# Patient Record
Sex: Male | Born: 1950 | Race: Black or African American | Hispanic: No | Marital: Married | Smoking: Never smoker
Health system: Southern US, Community
[De-identification: ages and names within clinical notes are randomized; demographics above are authoritative.]

## PROBLEM LIST (undated history)

## (undated) DIAGNOSIS — I10 Essential (primary) hypertension: Secondary | ICD-10-CM

## (undated) DIAGNOSIS — J301 Allergic rhinitis due to pollen: Secondary | ICD-10-CM

## (undated) DIAGNOSIS — J189 Pneumonia, unspecified organism: Secondary | ICD-10-CM

## (undated) DIAGNOSIS — C801 Malignant (primary) neoplasm, unspecified: Secondary | ICD-10-CM

## (undated) DIAGNOSIS — D569 Thalassemia, unspecified: Secondary | ICD-10-CM

## (undated) DIAGNOSIS — N400 Enlarged prostate without lower urinary tract symptoms: Secondary | ICD-10-CM

## (undated) HISTORY — DX: Thalassemia, unspecified: D56.9

## (undated) HISTORY — PX: HERNIA REPAIR: SHX51

## (undated) HISTORY — PX: ESOPHAGOGASTRODUODENOSCOPY: SHX1529

## (undated) HISTORY — DX: Pneumonia, unspecified organism: J18.9

## (undated) HISTORY — DX: Allergic rhinitis due to pollen: J30.1

## (undated) HISTORY — PX: TONSILLECTOMY: SUR1361

## (undated) HISTORY — DX: Benign prostatic hyperplasia without lower urinary tract symptoms: N40.0

## (undated) HISTORY — PX: TRANSURETHRAL RESECTION OF PROSTATE: SHX73

---

## 2007-11-05 HISTORY — PX: COLONOSCOPY: SHX174

## 2009-11-24 ENCOUNTER — Emergency Department: Payer: Self-pay | Admitting: Emergency Medicine

## 2009-11-24 IMAGING — CT CT ABD-PELV W/ CM
1 of 2 series · 15 of 32 positions shown, 19 images · non-contrast
Comparison: none

REASON FOR EXAM: (1) L sided abd pain; (2) L sided abd pain, PO and IV
contrast
COMMENTS:   May transport without cardiac monitor

[Series 2: abdomen · axial · 0.69mm/px · z∈[-514,-134]mm · 15 of 84 slices shown, 19 images]
[im 4/84  soft-tissue]
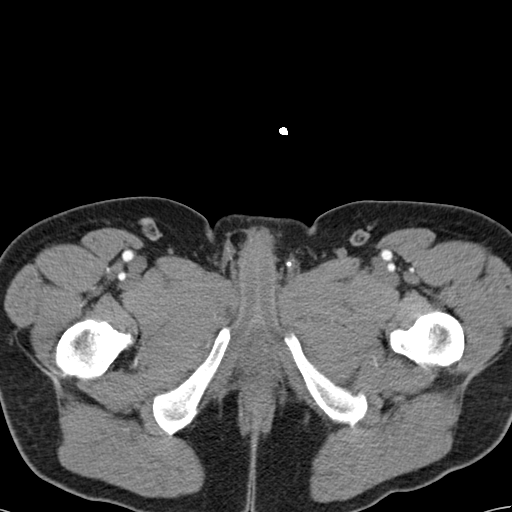
[im 4/84  bone]
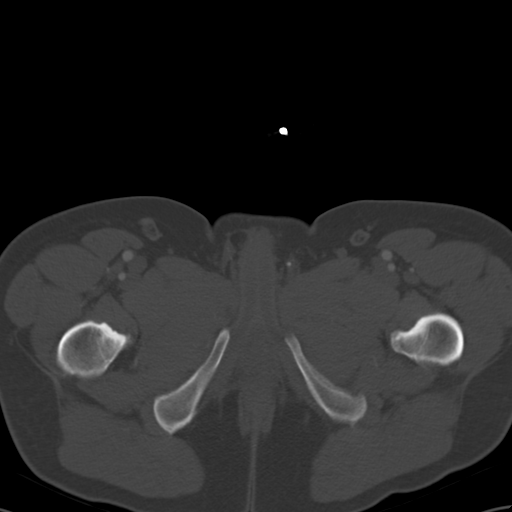
[im 10/84  soft-tissue]
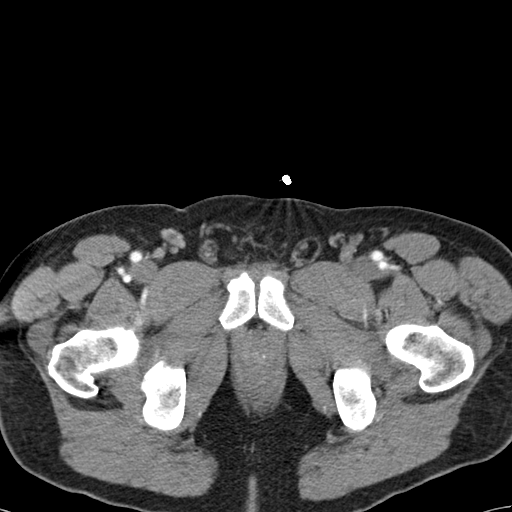
[im 17/84  soft-tissue]
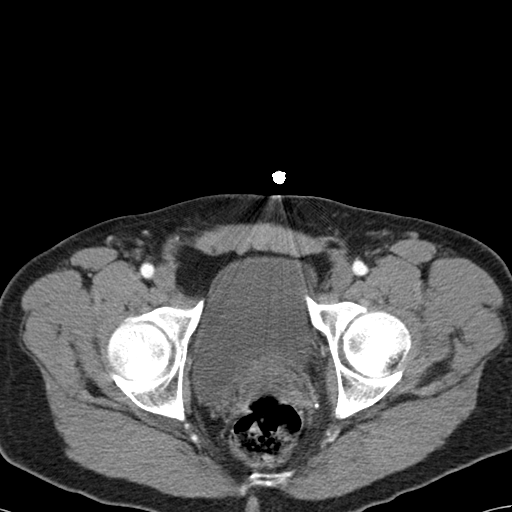
[im 24/84  soft-tissue]
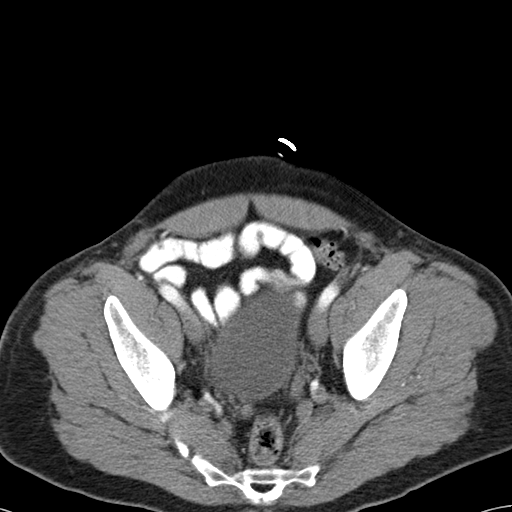
[im 30/84  soft-tissue]
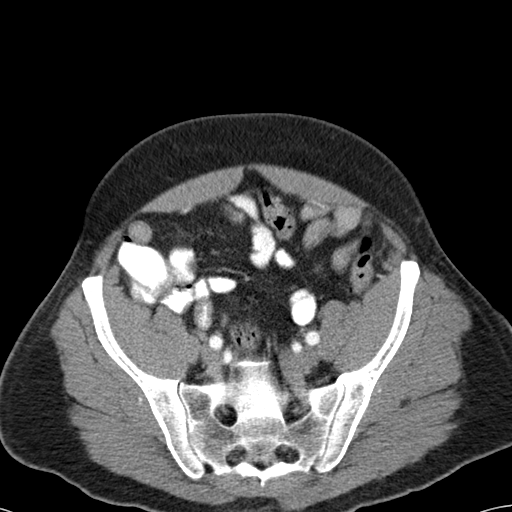
[im 37/84  soft-tissue]
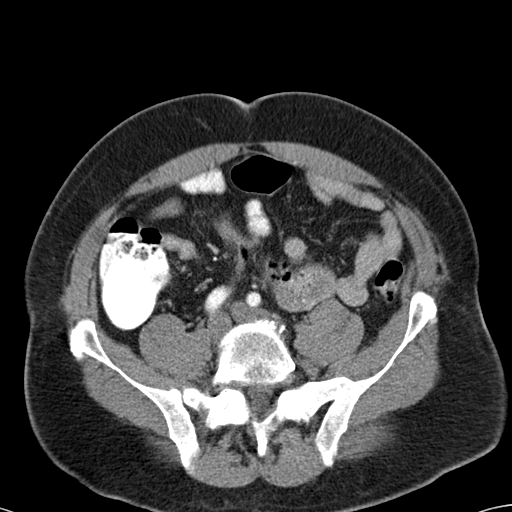
[im 44/84  soft-tissue]
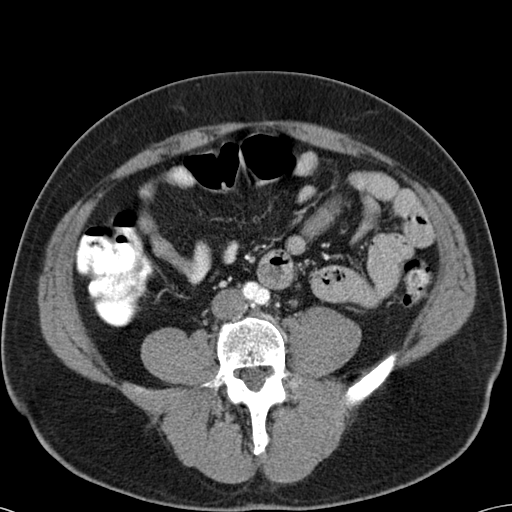
[im 47/84  soft-tissue]
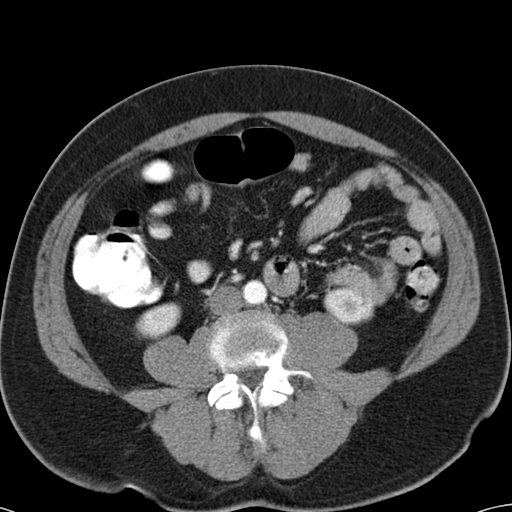
[im 54/84  soft-tissue]
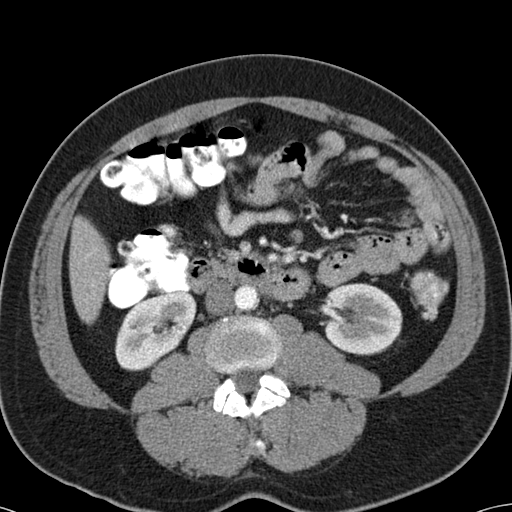
[im 54/84  bone]
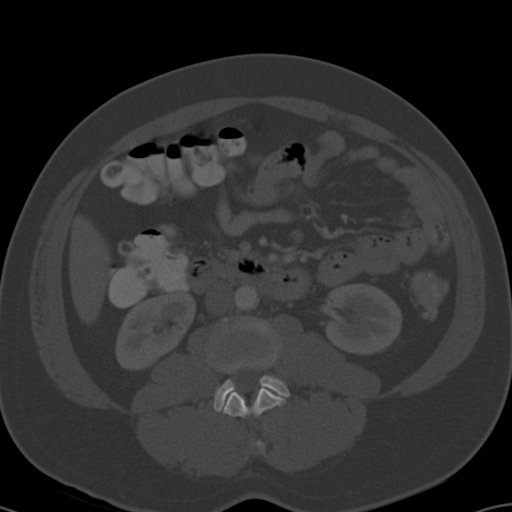
[im 60/84  soft-tissue]
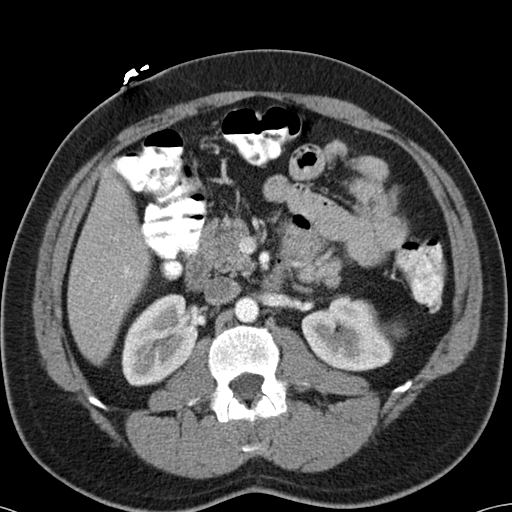
[im 67/84  soft-tissue]
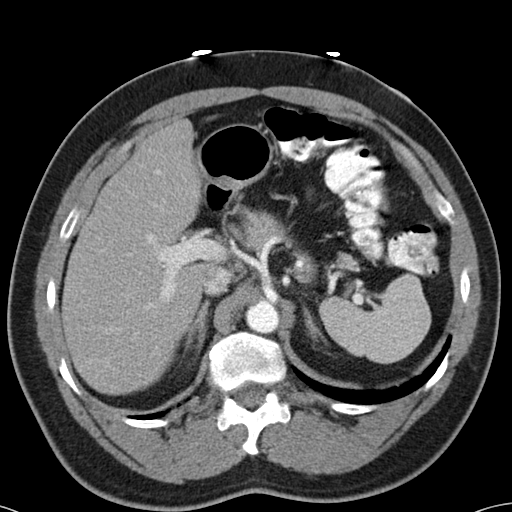
[im 70/84  lung]
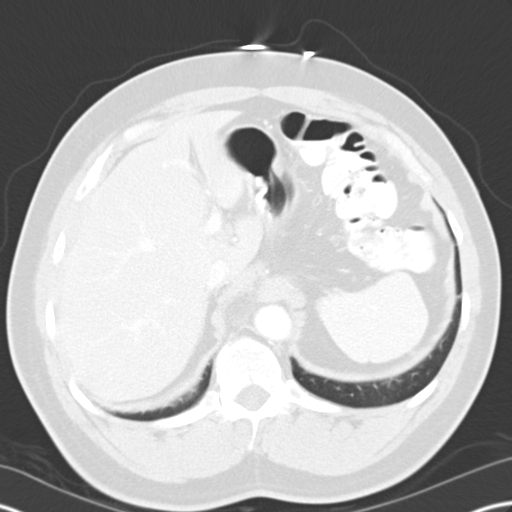
[im 74/84  soft-tissue]
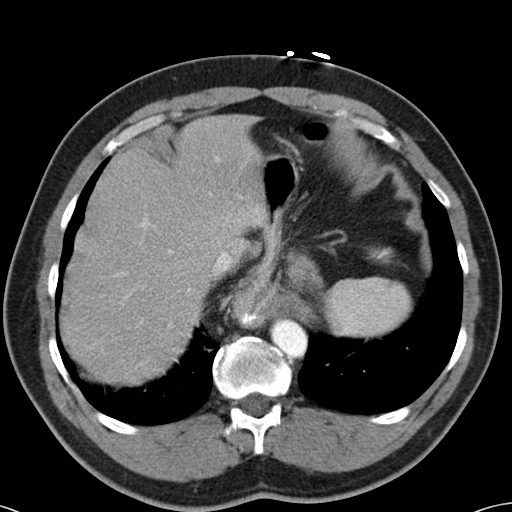
[im 74/84  lung]
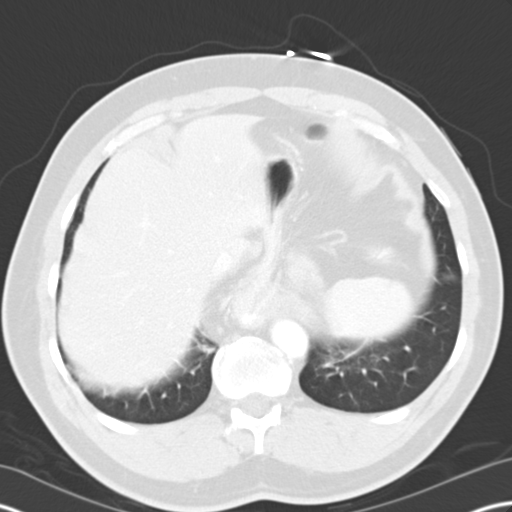
[im 77/84  lung]
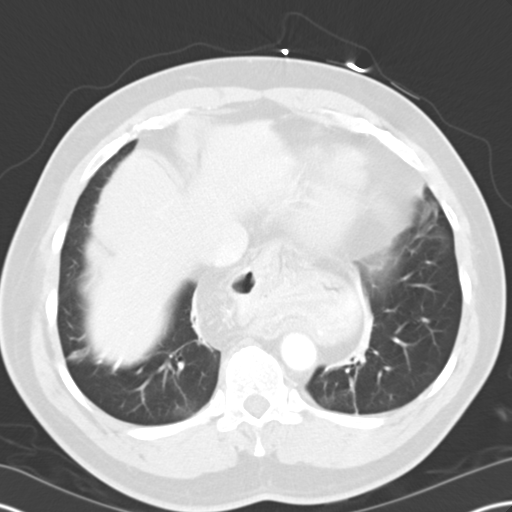
[im 80/84  soft-tissue]
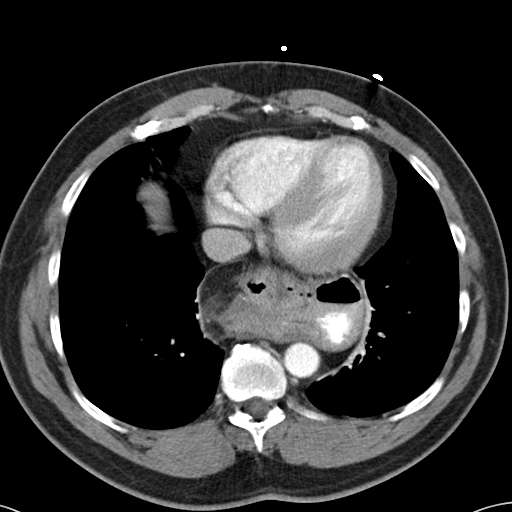
[im 80/84  lung]
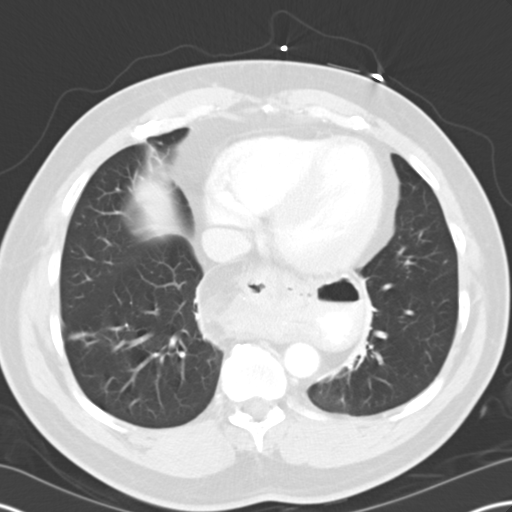

[15 of 32 positions shown; findings below may reference images not displayed]

PROCEDURE:     CT  - CT ABDOMEN / PELVIS  W  - [DATE]  [DATE]

RESULT:     CT of the abdomen and pelvis is performed utilizing 100 ml of
[EY] iodinated intravenous contrast along with oral contrast. There is
no previous CT for comparison. Images are reconstructed at 5 mm slice
thickness in the axial plane.

Images through the base of the lungs demonstrate minimal linear atelectasis
or fibrosis. There is a moderate to large hiatal hernia. There is some
thickening of the wall of the distal esophagus which could be secondary to
esophagitis. The heart appears normal in size. The liver, spleen, adrenal
glands and aorta appear to be within normal limits. The prostate is
borderline to mildly enlarged with some areas of calcification. Phleboliths
are present. A fat-filled umbilical hernia is present. There is no abnormal
bowel distention or bowel wall thickening. The pancreas shows no mass or
definite evidence of peripancreatic inflammation. There is a non-obstructing
2 mm stone in the lower pole the left kidney. The kidneys otherwise appear
to be unremarkable.
IMPRESSION: 1. Small fat-filled umbilical hernia.
2. Non-obstructing lower pole left renal calculus.
3. Moderately large hiatal hernia.

## 2010-01-26 ENCOUNTER — Emergency Department (HOSPITAL_COMMUNITY): Admission: EM | Admit: 2010-01-26 | Discharge: 2010-01-26 | Payer: Self-pay | Admitting: Emergency Medicine
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
## 2011-01-28 LAB — BASIC METABOLIC PANEL
BUN: 15 mg/dL (ref 6–23)
CO2: 24 mEq/L (ref 19–32)
Chloride: 110 mEq/L (ref 96–112)
Creatinine, Ser: 1.1 mg/dL (ref 0.4–1.5)
Potassium: 4 mEq/L (ref 3.5–5.1)
Sodium: 139 mEq/L (ref 135–145)

## 2011-01-28 LAB — DIFFERENTIAL
Band Neutrophils: 2 % (ref 0–10)
Basophils Relative: 0 % (ref 0–1)
Blasts: 0 %
Eosinophils Relative: 0 % (ref 0–5)
Lymphocytes Relative: 4 % — ABNORMAL LOW (ref 12–46)
Lymphs Abs: 0.4 10*3/uL — ABNORMAL LOW (ref 0.7–4.0)
Metamyelocytes Relative: 0 %
Monocytes Absolute: 0.6 10*3/uL (ref 0.1–1.0)
Myelocytes: 0 %
Neutrophils Relative %: 88 % — ABNORMAL HIGH (ref 43–77)
Promyelocytes Absolute: 0 %

## 2011-01-28 LAB — CBC
Hemoglobin: 11.3 g/dL — ABNORMAL LOW (ref 13.0–17.0)
MCHC: 31.8 g/dL (ref 30.0–36.0)
WBC: 9.9 10*3/uL (ref 4.0–10.5)

## 2011-01-28 LAB — SAMPLE TO BLOOD BANK

## 2012-03-04 HISTORY — PX: ESOPHAGOGASTRODUODENOSCOPY: SHX1529

## 2012-06-04 DIAGNOSIS — D569 Thalassemia, unspecified: Secondary | ICD-10-CM

## 2012-06-04 HISTORY — DX: Thalassemia, unspecified: D56.9

## 2013-01-18 ENCOUNTER — Emergency Department (HOSPITAL_COMMUNITY): Payer: BC Managed Care – PPO

## 2013-01-18 ENCOUNTER — Encounter (HOSPITAL_COMMUNITY): Payer: Self-pay

## 2013-01-18 ENCOUNTER — Emergency Department (HOSPITAL_COMMUNITY)
Admission: EM | Admit: 2013-01-18 | Discharge: 2013-01-18 | Disposition: A | Discharge status: Home / Self Care | Payer: BC Managed Care – PPO | Attending: Emergency Medicine | Admitting: Emergency Medicine

## 2013-01-18 DIAGNOSIS — Y939 Activity, unspecified: Secondary | ICD-10-CM | POA: Insufficient documentation

## 2013-01-18 DIAGNOSIS — S62639B Displaced fracture of distal phalanx of unspecified finger, initial encounter for open fracture: Secondary | ICD-10-CM | POA: Insufficient documentation

## 2013-01-18 DIAGNOSIS — W230XXA Caught, crushed, jammed, or pinched between moving objects, initial encounter: Secondary | ICD-10-CM | POA: Insufficient documentation

## 2013-01-18 DIAGNOSIS — Y929 Unspecified place or not applicable: Secondary | ICD-10-CM | POA: Insufficient documentation

## 2013-01-18 DIAGNOSIS — I10 Essential (primary) hypertension: Secondary | ICD-10-CM | POA: Insufficient documentation

## 2013-01-18 DIAGNOSIS — S62609B Fracture of unspecified phalanx of unspecified finger, initial encounter for open fracture: Secondary | ICD-10-CM

## 2013-01-18 HISTORY — DX: Essential (primary) hypertension: I10

## 2013-01-18 IMAGING — CR DG FINGER MIDDLE 2+V*L*
1 series · 1 of 1 positions shown · non-contrast
Comparison: None.

CLINICAL DATA: Finger caught and with stone.  Middle finger pain,
swelling, laceration.

LEFT MIDDLE FINGER 2+V

[view not recorded]
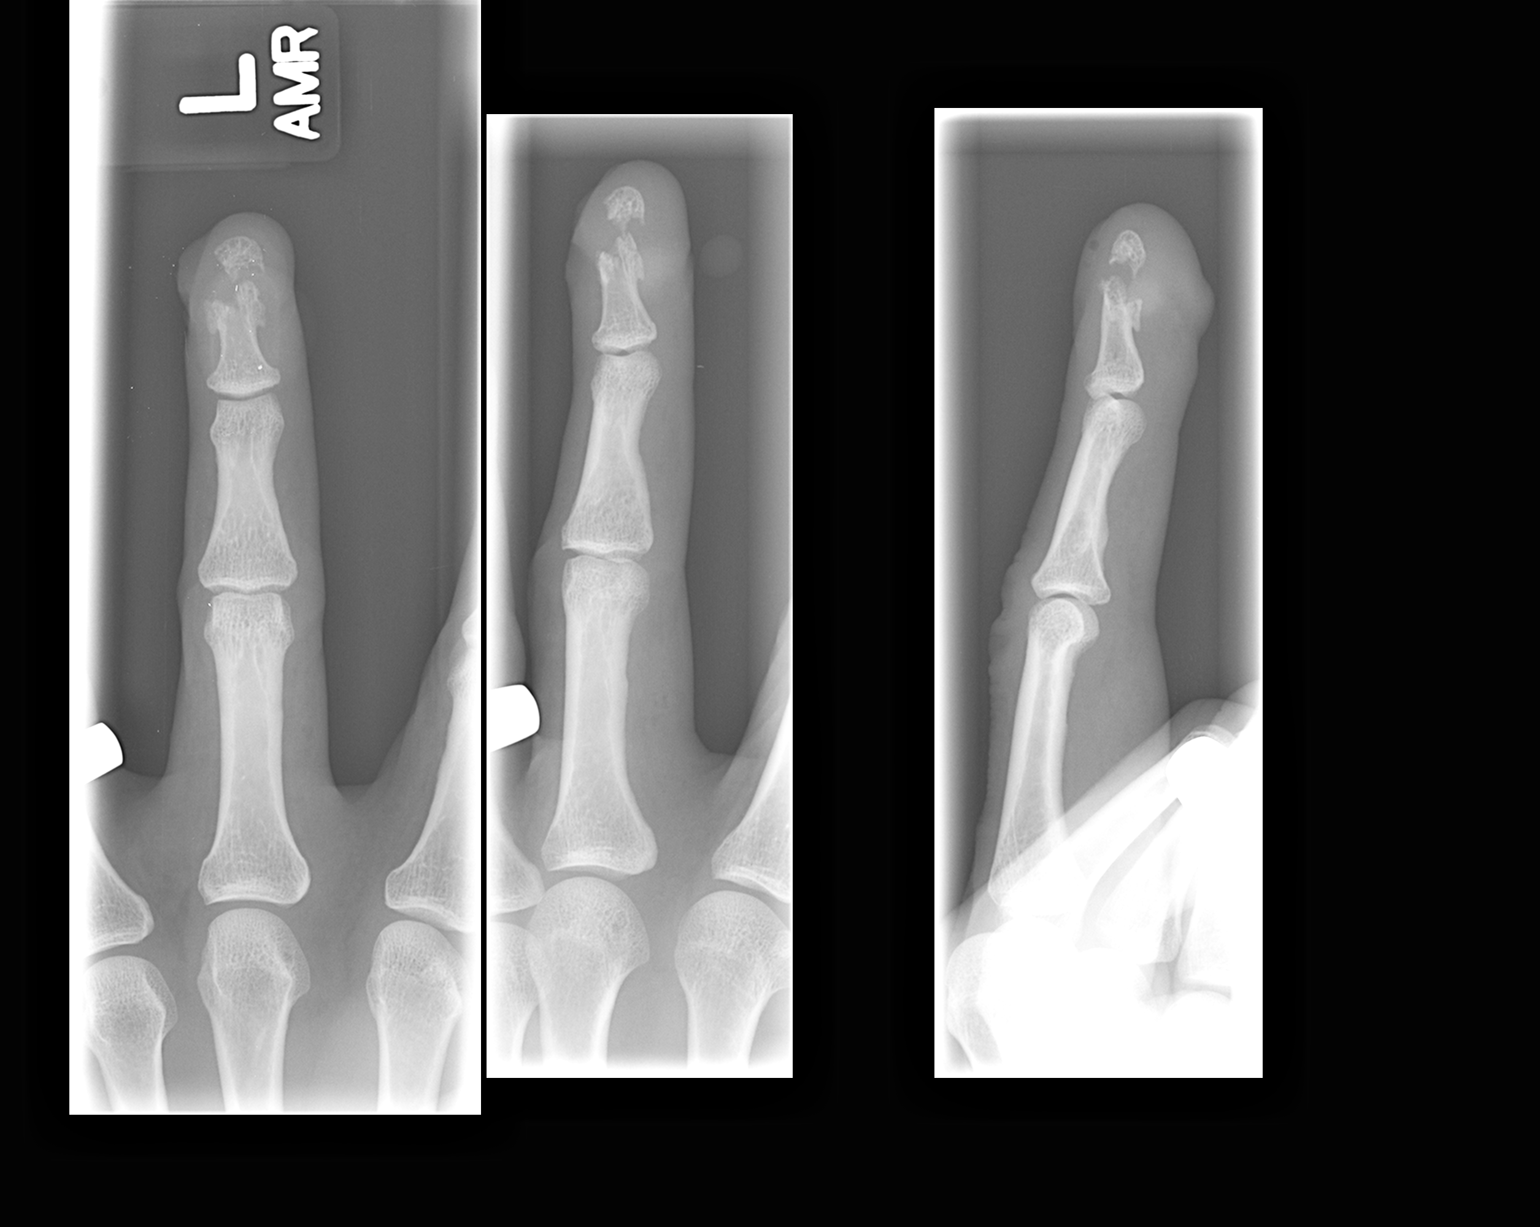

[1 of 1 positions shown; findings below may reference images not displayed]

FINDINGS: A comminuted displaced fracture of the tuft of the distal
phalanx is seen.  There is associated soft tissue swelling and a
small amount of soft tissue gas.  No radiopaque foreign body
identified.  No evidence of dislocation.
IMPRESSION: Comminuted, displaced fracture of the tuft of the distal phalanx.

## 2013-01-18 MED ORDER — AMOXICILLIN-POT CLAVULANATE 875-125 MG PO TABS: 1.0000 | ORAL_TABLET | Freq: Two times a day (BID) | ORAL | Status: DC

## 2013-01-18 MED ORDER — LIDOCAINE HCL (PF) 2 % IJ SOLN
INTRAMUSCULAR | Status: AC
  Administered 2013-01-18: 10:00:00
  Filled 2013-01-18: qty 10

## 2013-01-18 NOTE — ED Provider Notes (Signed)
History     CSN: [id number]  Arrival date & time 01/18/13  0713   First MD Initiated Contact with Patient 01/18/13 [phone]      Chief Complaint  Patient presents with  . Laceration    (Consider location/radiation/quality/duration/timing/severity/associated sxs/prior treatment) HPI Comments: Walter Harrison is a 62 y.o. Male presenting with a crush injury and laceration to his left distal Koper finger.  He was putting wood into his woodstove when his finger was caught between the wood and the edge of the stove.  He has laceration and nail plate avulsion of this finger.  It bled a moderate amount but is currently hemostatic.  He is right handed and is utd on his tetanus.     The history is provided by the patient.    Past Medical History  Diagnosis Date  . Hypertension     Past Surgical History  Procedure Laterality Date  . Hernia repair      No family history on file.  History  Substance Use Topics  . Smoking status: Never Smoker   . Smokeless tobacco: Not on file  . Alcohol Use: No      Review of Systems  Constitutional: Negative for fever.  Musculoskeletal: Positive for arthralgias. Negative for myalgias.  Skin: Positive for wound.  Neurological: Negative for weakness and numbness.    Allergies  Review of patient's allergies indicates not on file.  Home Medications  No current outpatient prescriptions on file.  BP 159/94  Pulse 98  Temp(Src) 97.7 F (36.5 C) (Oral)  Resp 20  Ht 5\' 8"  (1.727 m)  Wt 167 lb (75.751 kg)  BMI 25.4 kg/m2  SpO2 99%  Physical Exam  Constitutional: He is oriented to person, place, and time. He appears well-developed and well-nourished.  HENT:  Head: Normocephalic.  Cardiovascular: Normal rate.   Pulmonary/Chest: Effort normal.  Musculoskeletal: He exhibits tenderness.  Neurological: He is alert and oriented to person, place, and time. No sensory deficit.  Skin: Laceration noted.  Laceration on volar left 3rd Birchard finger,  horizontal mid tuft,  And wraps around to the lateral nail plate which is partially avulsed.  Distal sensation intact.      ED Course  Procedures (including critical care time)  Labs Reviewed - No data to display No results found.   No diagnosis found.    MDM  Spoke with Dr Janee Morn.  Advised to loosely approximate with chromic,  Augmentin,  Will see for a recheck in his office in 2-3 days.  Call back from his office staff,  Pt to be seen on Thursday in his office,  Will call patient with appt time.   Pt has oxycodone at home from his hernia surgery 1/14. Will use this for pain relief,  Also encouraged ice and elevation.    LACERATION REPAIR Performed by: Burgess Amor Authorized by: Burgess Amor Consent: Verbal consent obtained. Risks and benefits: risks, benefits and alternatives were discussed Consent given by: patient Patient identity confirmed: provided demographic data Prepped and Draped in normal sterile fashion Wound explored  Laceration Location: left Woessner finger  Laceration Length: 3cm  No Foreign Bodies seen or palpated  Anesthesia: digital block  Local anesthetic: lidocaine 2% without epinephrine  Anesthetic total: 2 ml  Irrigation method: syringe using saline after soak in saline,  Used wound clenz spray also before sutures placed Amount of cleaning: standard  Skin closure: chromic 4-0  Number of sutures: 4  Loosely approximated  Technique: simple interrupted  Patient tolerance: Patient  tolerated the procedure well with no immediate complications.  Note:  During procedure,  The nail plate became increasingly loose,  Barely attached at the lateral nail bed margin,  So removed.  The nail bed is injured,  Macerated type injury not amenable to simple suturing.    Finger was dressing,  First with xeroform,  Then bulky dressing.         Burgess Amor, PA-C 01/18/13 [phone]

## 2013-01-18 NOTE — ED Notes (Signed)
Pt was putting wood on fireplace this am and cut left hand middle finger.

## 2013-01-18 NOTE — ED Notes (Signed)
Gave pt basin with saline and betadine to soak his finger.

## 2013-01-19 NOTE — ED Provider Notes (Signed)
Medical screening examination/treatment/procedure(s) were conducted as a shared visit with non-physician practitioner(s) and myself.  I personally evaluated the patient during the encounter.  Open fracture to the distal phalanx of the left third digit. Primary repair in the emergency dept.  Referral to hand surgeon.  Donnetta Hutching, MD 01/19/13 [phone]

## 2014-02-15 ENCOUNTER — Telehealth: Payer: Self-pay | Admitting: *Deleted

## 2014-02-15 NOTE — Telephone Encounter (Signed)
I called pt and he said he has had a couple of colonoscopies before by Dr. Posey Pronto . His last one was about 5 years ago. He knows he had some polyps, he is not sure what kind but said he was supposed to get his next one in 5 years and it has been about that [place].  OV with Neil Crouch, PA on 5/18/2015at 8:30 AM.  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

## 2014-02-15 NOTE — Telephone Encounter (Signed)
Pt called to schedule a TCS. Please advise HOME # [phone] or CELL [phone]

## 2014-02-16 ENCOUNTER — Encounter: Payer: Self-pay | Admitting: *Deleted

## 2014-03-21 ENCOUNTER — Ambulatory Visit: Payer: BC Managed Care – PPO | Admitting: Gastroenterology

## 2014-03-24 ENCOUNTER — Ambulatory Visit (INDEPENDENT_AMBULATORY_CARE_PROVIDER_SITE_OTHER): Payer: BC Managed Care – PPO | Admitting: Gastroenterology

## 2014-03-24 ENCOUNTER — Encounter (INDEPENDENT_AMBULATORY_CARE_PROVIDER_SITE_OTHER): Payer: Self-pay

## 2014-03-24 ENCOUNTER — Encounter: Payer: Self-pay | Admitting: Gastroenterology

## 2014-03-24 ENCOUNTER — Other Ambulatory Visit: Payer: Self-pay | Admitting: Internal Medicine

## 2014-03-24 VITALS — BP 154/94 | HR 66 | Temp 97.4°F | Ht 68.0 in | Wt 185.4 lb

## 2014-03-24 DIAGNOSIS — Z860100 Personal history of colon polyps, unspecified: Secondary | ICD-10-CM | POA: Insufficient documentation

## 2014-03-24 DIAGNOSIS — K219 Gastro-esophageal reflux disease without esophagitis: Secondary | ICD-10-CM | POA: Insufficient documentation

## 2014-03-24 MED ORDER — PEG 3350-KCL-NA BICARB-NACL 420 G PO SOLR: 4000.0000 mL | ORAL | Status: DC

## 2014-03-24 NOTE — Assessment & Plan Note (Signed)
Doing well post what sounds like Nissen fundoplication. No longer on PPI therapy. Patient describes having 7-8 yearly endoscopies prior to his surgery. We will request records for further review. He denies any history Barrett's esophagus.

## 2014-03-24 NOTE — Progress Notes (Signed)
Primary Care Physician:  [facility]  Primary Gastroenterologist:    Chief Complaint  Patient presents with  . Colonoscopy    HPI:  Walter Harrison is a 63 y.o. male here to schedule surveillance colonoscopy. He has a history of colon polyps and has been having surveillance colonoscopies every 5 years by Dr. Posey Pronto. Patient desires coming to our practice for future colonoscopies. He is doing well. Denies any constipation, diarrhea, abdominal pain, melena, rectal bleeding, weight loss. He has a history of chronic GERD with large hiatal hernia requiring repair at [facility] previously. He has been off PPI therapy since that time and doing well. He denies any history Barrett's esophagus although he tells me he was having endoscopies on a yearly basis for about 8 years prior to his surgery. His last endoscopy about one to 2 years ago. Records have been requested.  Current Outpatient Prescriptions  Medication Sig Dispense Refill  . aspirin 81 MG tablet Take 81 mg by mouth daily.      . Multiple Vitamin (MULTIVITAMIN) tablet Take 1 tablet by mouth daily.       No current facility-administered medications for this visit.    Allergies as of 03/24/2014 - Review Complete 03/24/2014  Allergen Reaction Noted  . Sulfa antibiotics Nausea Only 01/18/2013    Past Medical History  Diagnosis Date  . Hypertension   . Hay fever   . Pneumonia   . Thalassemia 06/2012  . BPH (benign prostatic hyperplasia)     Past Surgical History  Procedure Laterality Date  . Hernia repair      hiatal hernia repair, [facility]  . Tonsillectomy    . Colonoscopy      h/o colon polyps, last TCS Dr. Posey Pronto, 2009  . Transurethral resection of prostate    . Esophagogastroduodenoscopy      multiple times by Dr. Posey Pronto, 7-8 times. yearly for the hiatal hernia. no barrett's esophagus per patient. last EGD, about 2 years ago, Dr. Posey Pronto    Family History  Problem Relation Age of Onset  . Cancer - Prostate Father    . Stroke Sister   . Kidney disease Sister   . Prostate cancer Brother     has had bladder cancer, now fighting four battle with cancer  . Colon cancer Neg Hx     History   Social History  . Marital Status: Married    Spouse Name: N/A    Number of Children: 3  . Years of Education: N/A   Occupational History  .     Social History Main Topics  . Smoking status: Never Smoker   . Smokeless tobacco: Not on file  . Alcohol Use: No  . Drug Use: No  . Sexual Activity: Yes   Other Topics Concern  . Not on file   Social History Narrative  . No narrative on file      ROS:  General: Negative for anorexia, weight loss, fever, chills, fatigue, weakness. Eyes: Negative for vision changes.  ENT: Negative for hoarseness, difficulty swallowing , nasal congestion. CV: Negative for chest pain, angina, palpitations, dyspnea on exertion, peripheral edema.  Respiratory: Negative for dyspnea at rest, dyspnea on exertion, cough, sputum, wheezing.  GI: See history of present illness. GU:  Negative for dysuria, hematuria, urinary incontinence, urinary frequency, nocturnal urination.  MS: Negative for joint pain, low back pain.  Derm: Negative for rash or itching.  Neuro: Negative for weakness, abnormal sensation, seizure, frequent headaches, memory loss, confusion.  Psych: Negative for anxiety, depression, suicidal ideation, hallucinations.  Endo: Negative for unusual weight change.  Heme: Negative for bruising or bleeding. Allergy: Negative for rash or hives.    Physical Examination:  BP 154/94  Pulse 66  Temp(Src) 97.4 F (36.3 C) (Oral)  Ht 5\' 8"  (1.727 m)  Wt 185 lb 6.4 oz (84.097 kg)  BMI 28.20 kg/m2   General: Well-nourished, well-developed in no acute distress.  Head: Normocephalic, atraumatic.   Eyes: Conjunctiva pink, no icterus. Mouth: Oropharyngeal mucosa moist and pink , no lesions erythema or exudate. Neck: Supple without thyromegaly, masses, or lymphadenopathy.   Lungs: Clear to auscultation bilaterally.  Heart: Regular rate and rhythm, no murmurs rubs or gallops.  Abdomen: Bowel sounds are normal, nontender, nondistended, no hepatosplenomegaly or masses, no abdominal bruits or    hernia , no rebound or guarding.   Rectal: not performed Extremities: No lower extremity edema. No clubbing or deformities.  Neuro: Alert and oriented x 4 , grossly normal neurologically.  Skin: Warm and dry, no rash or jaundice.   Psych: Alert and cooperative, normal mood and affect.  Labs: Labs from April 2015. Total bilirubin 0.7, alkaline phosphatase 74, AST 19, ALT 18, albumin 4.5, creatinine 0.98  Imaging Studies: No results found.

## 2014-03-24 NOTE — Assessment & Plan Note (Signed)
Due for surveillance colonoscopy at this time.  I have discussed the risks, alternatives, benefits with regards to but not limited to the risk of reaction to medication, bleeding, infection, perforation and the patient is agreeable to proceed. Written consent to be obtained.  

## 2014-03-24 NOTE — Patient Instructions (Signed)
1. Colonoscopy as scheduled. See separate instructions.  2. I will review copy of prior upper endoscopy from Dr. Posey Pronto. If you need repeat upper endoscopy we will let you know.

## 2014-03-25 ENCOUNTER — Encounter (HOSPITAL_COMMUNITY): Payer: Self-pay | Admitting: Pharmacy Technician

## 2014-03-30 ENCOUNTER — Encounter: Payer: Self-pay | Admitting: Gastroenterology

## 2014-03-30 NOTE — Progress Notes (Signed)
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 Posey Pronto. No mention of Barrett's esophagus. No EGD needed at this time.

## 2014-04-07 ENCOUNTER — Encounter (HOSPITAL_COMMUNITY)
Admission: RE | Disposition: A | Discharge status: Home / Self Care | Payer: Self-pay | Source: Ambulatory Visit | Attending: Internal Medicine

## 2014-04-07 ENCOUNTER — Ambulatory Visit (HOSPITAL_COMMUNITY)
Admission: RE | Admit: 2014-04-07 | Discharge: 2014-04-07 | Disposition: A | Discharge status: Home / Self Care | Payer: BC Managed Care – PPO | Source: Ambulatory Visit | Attending: Internal Medicine | Admitting: Internal Medicine

## 2014-04-07 ENCOUNTER — Encounter (HOSPITAL_COMMUNITY): Payer: Self-pay | Admitting: *Deleted

## 2014-04-07 DIAGNOSIS — K5730 Diverticulosis of large intestine without perforation or abscess without bleeding: Secondary | ICD-10-CM

## 2014-04-07 DIAGNOSIS — Z860100 Personal history of colon polyps, unspecified: Secondary | ICD-10-CM | POA: Insufficient documentation

## 2014-04-07 DIAGNOSIS — K449 Diaphragmatic hernia without obstruction or gangrene: Secondary | ICD-10-CM | POA: Insufficient documentation

## 2014-04-07 DIAGNOSIS — Z1211 Encounter for screening for malignant neoplasm of colon: Secondary | ICD-10-CM

## 2014-04-07 DIAGNOSIS — K219 Gastro-esophageal reflux disease without esophagitis: Secondary | ICD-10-CM | POA: Insufficient documentation

## 2014-04-07 DIAGNOSIS — Z882 Allergy status to sulfonamides status: Secondary | ICD-10-CM | POA: Insufficient documentation

## 2014-04-07 DIAGNOSIS — Z7982 Long term (current) use of aspirin: Secondary | ICD-10-CM | POA: Insufficient documentation

## 2014-04-07 HISTORY — PX: COLONOSCOPY: SHX5424

## 2014-04-07 SURGERY — COLONOSCOPY
Anesthesia: Moderate Sedation

## 2014-04-07 MED ORDER — ONDANSETRON HCL 4 MG/2ML IJ SOLN
INTRAMUSCULAR | Status: AC
  Filled 2014-04-07: qty 2

## 2014-04-07 MED ORDER — MIDAZOLAM HCL 5 MG/5ML IJ SOLN
INTRAMUSCULAR | Status: DC | PRN
  Administered 2014-04-07: 1 mg via INTRAVENOUS
  Administered 2014-04-07: 2 mg via INTRAVENOUS
  Administered 2014-04-07 (×2): 1 mg via INTRAVENOUS

## 2014-04-07 MED ORDER — MEPERIDINE HCL 100 MG/ML IJ SOLN
INTRAMUSCULAR | Status: AC
  Filled 2014-04-07: qty 2

## 2014-04-07 MED ORDER — ONDANSETRON HCL 4 MG/2ML IJ SOLN
INTRAMUSCULAR | Status: DC | PRN
  Administered 2014-04-07: 4 mg via INTRAVENOUS

## 2014-04-07 MED ORDER — MIDAZOLAM HCL 5 MG/5ML IJ SOLN
INTRAMUSCULAR | Status: AC
  Filled 2014-04-07: qty 10

## 2014-04-07 MED ORDER — MEPERIDINE HCL 100 MG/ML IJ SOLN
INTRAMUSCULAR | Status: DC | PRN
  Administered 2014-04-07: 50 mg via INTRAVENOUS

## 2014-04-07 MED ORDER — STERILE WATER FOR IRRIGATION IR SOLN
Status: DC | PRN
  Administered 2014-04-07: 13:00:00

## 2014-04-07 NOTE — Interval H&P Note (Signed)
History and Physical Interval Note:  04/07/2014 1:05 PM  Walter Harrison  has presented today for surgery, with the diagnosis of HISTORY OF COLON POLYPS  The various methods of treatment have been discussed with the patient and family. After consideration of risks, benefits and other options for treatment, the patient has consented to  Procedure(s) with comments: COLONOSCOPY (N/A) - 1:45 as a surgical intervention .  The patient's history has been reviewed, patient examined, no change in status, stable for surgery.  I have reviewed the patient's chart and labs.  Questions were answered to the patient's satisfaction.   No change. Colonoscopy per plan.The risks, benefits, limitations, alternatives and imponderables have been reviewed with the patient. Questions have been answered. All parties are agreeable.   Cristopher Estimable Shekira Drummer

## 2014-04-07 NOTE — H&P (View-Only) (Signed)
Primary Care Physician:  [facility]  Primary Gastroenterologist:    Chief Complaint  Patient presents with  . Colonoscopy    HPI:  Walter Harrison is a 63 y.o. male here to schedule surveillance colonoscopy. He has a history of colon polyps and has been having surveillance colonoscopies every 5 years by Dr. Posey Pronto. Patient desires coming to our practice for future colonoscopies. He is doing well. Denies any constipation, diarrhea, abdominal pain, melena, rectal bleeding, weight loss. He has a history of chronic GERD with large hiatal hernia requiring repair at [facility] previously. He has been off PPI therapy since that time and doing well. He denies any history Barrett's esophagus although he tells me he was having endoscopies on a yearly basis for about 8 years prior to his surgery. His last endoscopy about one to 2 years ago. Records have been requested.  Current Outpatient Prescriptions  Medication Sig Dispense Refill  . aspirin 81 MG tablet Take 81 mg by mouth daily.      . Multiple Vitamin (MULTIVITAMIN) tablet Take 1 tablet by mouth daily.       No current facility-administered medications for this visit.    Allergies as of 03/24/2014 - Review Complete 03/24/2014  Allergen Reaction Noted  . Sulfa antibiotics Nausea Only 01/18/2013    Past Medical History  Diagnosis Date  . Hypertension   . Hay fever   . Pneumonia   . Thalassemia 06/2012  . BPH (benign prostatic hyperplasia)     Past Surgical History  Procedure Laterality Date  . Hernia repair      hiatal hernia repair, [facility]  . Tonsillectomy    . Colonoscopy      h/o colon polyps, last TCS Dr. Posey Pronto, 2009  . Transurethral resection of prostate    . Esophagogastroduodenoscopy      multiple times by Dr. Posey Pronto, 7-8 times. yearly for the hiatal hernia. no barrett's esophagus per patient. last EGD, about 2 years ago, Dr. Posey Pronto    Family History  Problem Relation Age of Onset  . Cancer - Prostate Father    . Stroke Sister   . Kidney disease Sister   . Prostate cancer Brother     has had bladder cancer, now fighting four battle with cancer  . Colon cancer Neg Hx     History   Social History  . Marital Status: Married    Spouse Name: N/A    Number of Children: 3  . Years of Education: N/A   Occupational History  .     Social History Main Topics  . Smoking status: Never Smoker   . Smokeless tobacco: Not on file  . Alcohol Use: No  . Drug Use: No  . Sexual Activity: Yes   Other Topics Concern  . Not on file   Social History Narrative  . No narrative on file      ROS:  General: Negative for anorexia, weight loss, fever, chills, fatigue, weakness. Eyes: Negative for vision changes.  ENT: Negative for hoarseness, difficulty swallowing , nasal congestion. CV: Negative for chest pain, angina, palpitations, dyspnea on exertion, peripheral edema.  Respiratory: Negative for dyspnea at rest, dyspnea on exertion, cough, sputum, wheezing.  GI: See history of present illness. GU:  Negative for dysuria, hematuria, urinary incontinence, urinary frequency, nocturnal urination.  MS: Negative for joint pain, low back pain.  Derm: Negative for rash or itching.  Neuro: Negative for weakness, abnormal sensation, seizure, frequent headaches, memory loss, confusion.  Psych: Negative for anxiety, depression, suicidal ideation, hallucinations.  Endo: Negative for unusual weight change.  Heme: Negative for bruising or bleeding. Allergy: Negative for rash or hives.    Physical Examination:  BP 154/94  Pulse 66  Temp(Src) 97.4 F (36.3 C) (Oral)  Ht 5\' 8"  (1.727 m)  Wt 185 lb 6.4 oz (84.097 kg)  BMI 28.20 kg/m2   General: Well-nourished, well-developed in no acute distress.  Head: Normocephalic, atraumatic.   Eyes: Conjunctiva pink, no icterus. Mouth: Oropharyngeal mucosa moist and pink , no lesions erythema or exudate. Neck: Supple without thyromegaly, masses, or lymphadenopathy.   Lungs: Clear to auscultation bilaterally.  Heart: Regular rate and rhythm, no murmurs rubs or gallops.  Abdomen: Bowel sounds are normal, nontender, nondistended, no hepatosplenomegaly or masses, no abdominal bruits or    hernia , no rebound or guarding.   Rectal: not performed Extremities: No lower extremity edema. No clubbing or deformities.  Neuro: Alert and oriented x 4 , grossly normal neurologically.  Skin: Warm and dry, no rash or jaundice.   Psych: Alert and cooperative, normal mood and affect.  Labs: Labs from April 2015. Total bilirubin 0.7, alkaline phosphatase 74, AST 19, ALT 18, albumin 4.5, creatinine 0.98  Imaging Studies: No results found.

## 2014-04-07 NOTE — Op Note (Signed)
[facility] [address] [place], [address]   COLONOSCOPY PROCEDURE REPORT  PATIENT: Walter Harrison, Walter Harrison  MR#:         [id number] BIRTHDATE: September 24, 1951 , 43  yrs. old GENDER: Male ENDOSCOPIST: R.  Garfield Cornea, MD Quentin Ore REFERRED BY:     [facility] PROCEDURE DATE:  04/07/2014 PROCEDURE:     Surveillance ileocolonoscopy  INDICATIONS: history of colonic polyps removed elsewhere  INFORMED CONSENT:  The risks, benefits, alternatives and imponderables including but not limited to bleeding, perforation as well as the possibility of a missed lesion have been reviewed.  The potential for biopsy, lesion removal, etc. have also been discussed.  Questions have been answered.  All parties agreeable. Please see the history and physical in the medical record for more information.  MEDICATIONS: Versed 5 mg IV and Demerol 75 mg IV in divided doses. Zofran 4 mg IV.  DESCRIPTION OF PROCEDURE:  After a digital rectal exam was performed, the EC-[id number] ([id number])  colonoscope was advanced from the anus through the rectum and colon to the area of the cecum, ileocecal valve and appendiceal orifice.  The cecum was deeply intubated.  These structures were well-seen and photographed for the record.  From the level of the cecum and ileocecal valve, the scope was slowly and cautiously withdrawn.  The mucosal surfaces were carefully surveyed utilizing scope tip deflection to facilitate fold flattening as needed.  The scope was pulled down into the rectum where a thorough examination including retroflexion was performed.    FINDINGS:  Adequate Preparation. Normal rectum. Scattered left-sided and transverse diverticula; the remainder of the colonic mucosa appeared normal.  THERAPEUTIC / DIAGNOSTIC MANEUVERS PERFORMED:  none  COMPLICATIONS: None  CECAL WITHDRAWAL TIME:  7 minutes  IMPRESSION:  Colonic diverticulosis  RECOMMENDATIONS: Repeat surveillance colonoscopy  in 5 years.   _______________________________ eSigned:  R. Garfield Cornea, MD FACP [facility] 04/07/2014 1:39 PM   CC:

## 2014-04-07 NOTE — Discharge Instructions (Addendum)
Colonoscopy Discharge Instructions  Read the instructions outlined below and refer to this sheet in the next few weeks. These discharge instructions provide you with general information on caring for yourself after you leave the hospital. Your doctor may also give you specific instructions. While your treatment has been planned according to the most current medical practices available, unavoidable complications occasionally occur. If you have any problems or questions after discharge, call Dr. Gala Romney at [phone]. ACTIVITY  You may resume your regular activity, but move at a slower pace for the next 24 hours.   Take frequent rest periods for the next 24 hours.   Walking will help get rid of the air and reduce the bloated feeling in your belly (abdomen).   No driving for 24 hours (because of the medicine (anesthesia) used during the test).    Do not sign any important legal documents or operate any machinery for 24 hours (because of the anesthesia used during the test).  NUTRITION  Drink plenty of fluids.   You may resume your normal diet as instructed by your doctor.   Begin with a light meal and progress to your normal diet. Heavy or fried foods are harder to digest and may make you feel sick to your stomach (nauseated).   Avoid alcoholic beverages for 24 hours or as instructed.  MEDICATIONS  You may resume your normal medications unless your doctor tells you otherwise.  WHAT YOU CAN EXPECT TODAY  Some feelings of bloating in the abdomen.   Passage of more gas than usual.   Spotting of blood in your stool or on the toilet paper.  IF YOU HAD POLYPS REMOVED DURING THE COLONOSCOPY:  No aspirin products for 7 days or as instructed.   No alcohol for 7 days or as instructed.   Eat a soft diet for the next 24 hours.  FINDING OUT THE RESULTS OF YOUR TEST Not all test results are available during your visit. If your test results are not back during the visit, make an appointment  with your caregiver to find out the results. Do not assume everything is normal if you have not heard from your caregiver or the medical facility. It is important for you to follow up on all of your test results.  SEEK IMMEDIATE MEDICAL ATTENTION IF:  You have more than a spotting of blood in your stool.   Your belly is swollen (abdominal distention).   You are nauseated or vomiting.   You have a temperature over 101.   You have abdominal pain or discomfort that is severe or gets worse throughout the day.     Diverticulosis information provided  Repeat colonoscopy in 5 years for surveillance purposes  Diverticulosis Diverticulosis is a common condition that develops when small pouches (diverticula) form in the wall of the colon. The risk of diverticulosis increases with age. It happens more often in people who eat a low-fiber diet. Most individuals with diverticulosis have no symptoms. Those individuals with symptoms usually experience abdominal pain, constipation, or loose stools (diarrhea). HOME CARE INSTRUCTIONS   Increase the amount of fiber in your diet as directed by your caregiver or dietician. This may reduce symptoms of diverticulosis.  Your caregiver may recommend taking a dietary fiber supplement.  Drink at least 6 to 8 glasses of water each day to prevent constipation.  Try not to strain when you have a bowel movement.  Your caregiver may recommend avoiding nuts and seeds to prevent complications, although this is still an  uncertain benefit.  Only take over-the-counter or prescription medicines for pain, discomfort, or fever as directed by your caregiver. FOODS WITH HIGH FIBER CONTENT INCLUDE:  Fruits. Apple, peach, pear, tangerine, raisins, prunes.  Vegetables. Brussels sprouts, asparagus, broccoli, cabbage, carrot, cauliflower, romaine lettuce, spinach, summer squash, tomato, winter squash, zucchini.  Starchy Vegetables. Baked beans, kidney beans, lima beans,  split peas, lentils, potatoes (with skin).  Grains. Whole wheat bread, brown rice, bran flake cereal, plain oatmeal, white rice, shredded wheat, bran muffins. SEEK IMMEDIATE MEDICAL CARE IF:   You develop increasing pain or severe bloating.  You have an oral temperature above 102 F (38.9 C), not controlled by medicine.  You develop vomiting or bowel movements that are bloody or black.

## 2014-04-08 ENCOUNTER — Encounter (HOSPITAL_COMMUNITY): Payer: Self-pay | Admitting: Internal Medicine

## 2015-03-28 ENCOUNTER — Emergency Department (HOSPITAL_COMMUNITY)
Admission: EM | Admit: 2015-03-28 | Discharge: 2015-03-28 | Disposition: A | Discharge status: Home / Self Care | Payer: BC Managed Care – PPO | Attending: Emergency Medicine | Admitting: Emergency Medicine

## 2015-03-28 ENCOUNTER — Encounter (HOSPITAL_COMMUNITY): Payer: Self-pay | Admitting: *Deleted

## 2015-03-28 DIAGNOSIS — Z8701 Personal history of pneumonia (recurrent): Secondary | ICD-10-CM | POA: Diagnosis not present

## 2015-03-28 DIAGNOSIS — I10 Essential (primary) hypertension: Secondary | ICD-10-CM | POA: Insufficient documentation

## 2015-03-28 DIAGNOSIS — Z87448 Personal history of other diseases of urinary system: Secondary | ICD-10-CM | POA: Insufficient documentation

## 2015-03-28 DIAGNOSIS — J0100 Acute maxillary sinusitis, unspecified: Secondary | ICD-10-CM | POA: Diagnosis not present

## 2015-03-28 DIAGNOSIS — R531 Weakness: Secondary | ICD-10-CM | POA: Diagnosis present

## 2015-03-28 DIAGNOSIS — Z862 Personal history of diseases of the blood and blood-forming organs and certain disorders involving the immune mechanism: Secondary | ICD-10-CM | POA: Diagnosis not present

## 2015-03-28 LAB — BASIC METABOLIC PANEL
Anion gap: 9 (ref 5–15)
BUN: 9 mg/dL (ref 6–20)
CHLORIDE: 102 mmol/L (ref 101–111)
CO2: 26 mmol/L (ref 22–32)
Calcium: 9.5 mg/dL (ref 8.9–10.3)
Creatinine, Ser: 1.08 mg/dL (ref 0.61–1.24)
GFR calc non Af Amer: >60 mL/min (ref >60)
Glucose, Bld: 107 mg/dL — ABNORMAL HIGH (ref 65–99)
POTASSIUM: 3.4 mmol/L — AB (ref 3.5–5.1)
SODIUM: 137 mmol/L (ref 135–145)

## 2015-03-28 LAB — CBC
HEMATOCRIT: 46 % (ref 39.0–52.0)
Hemoglobin: 15.9 g/dL (ref 13.0–17.0)
MCH: 29.3 pg (ref 26.0–34.0)
MCHC: 34.6 g/dL (ref 30.0–36.0)
MCV: 84.9 fL (ref 78.0–100.0)
Platelets: 168 10*3/uL (ref 150–400)
RBC: 5.42 MIL/uL (ref 4.22–5.81)
RDW: 14.3 % (ref 11.5–15.5)
WBC: 9.9 10*3/uL (ref 4.0–10.5)

## 2015-03-28 MED ORDER — POTASSIUM CHLORIDE CRYS ER 20 MEQ PO TBCR
20.0000 meq | EXTENDED_RELEASE_TABLET | Freq: Once | ORAL | Status: AC
  Administered 2015-03-28: 20 meq via ORAL
  Filled 2015-03-28: qty 1

## 2015-03-28 MED ORDER — PSEUDOEPHEDRINE HCL 60 MG PO TABS
ORAL_TABLET | ORAL | Status: AC
  Filled 2015-03-28: qty 2

## 2015-03-28 MED ORDER — CETIRIZINE-PSEUDOEPHEDRINE ER 5-120 MG PO TB12: 1.0000 | ORAL_TABLET | Freq: Two times a day (BID) | ORAL | Status: DC | PRN

## 2015-03-28 MED ORDER — LORATADINE 10 MG PO TABS
10.0000 mg | ORAL_TABLET | Freq: Every day | ORAL | Status: DC
  Administered 2015-03-28: 10 mg via ORAL
  Filled 2015-03-28: qty 1

## 2015-03-28 MED ORDER — AMOXICILLIN-POT CLAVULANATE 875-125 MG PO TABS
1.0000 | ORAL_TABLET | Freq: Once | ORAL | Status: AC
  Administered 2015-03-28: 1 via ORAL
  Filled 2015-03-28: qty 1

## 2015-03-28 MED ORDER — AMOXICILLIN-POT CLAVULANATE 875-125 MG PO TABS: 1.0000 | ORAL_TABLET | Freq: Two times a day (BID) | ORAL | Status: DC

## 2015-03-28 MED ORDER — PSEUDOEPHEDRINE HCL ER 120 MG PO TB12
120.0000 mg | ORAL_TABLET | Freq: Two times a day (BID) | ORAL | Status: DC
  Administered 2015-03-28: 120 mg via ORAL
  Filled 2015-03-28 (×4): qty 1

## 2015-03-28 NOTE — ED Notes (Signed)
Pt c/o feeling weak and having a headache; pt states his nose has been stuffy

## 2015-03-28 NOTE — Discharge Instructions (Signed)
It was our pleasure to provide your ER care today - we hope that you feel better.  Rest. Drink plenty of fluids.  From todays labs, your potassium level is slightly low (3.4) - eat plenty of fruits and vegetables, and follow up with primary care doctor in the next couple weeks.   Take zyrtec-d as need for congestion. Take antibiotic (augmentin) as prescribed.  Follow up with primary care doctor in the next few days if symptoms fail to improve/resolve.  Return to ER if worse, new symptoms, severe headache, weak/fainting, trouble breathing, vomiting, other concern.   Sinusitis Sinusitis is redness, soreness, and inflammation of the paranasal sinuses. Paranasal sinuses are air pockets within the bones of your face (beneath the eyes, the middle of the forehead, or above the eyes). In healthy paranasal sinuses, mucus is able to drain out, and air is able to circulate through them by way of your nose. However, when your paranasal sinuses are inflamed, mucus and air can become trapped. This can allow bacteria and other germs to grow and cause infection. Sinusitis can develop quickly and last only a short time (acute) or continue over a Leyda period (chronic). Sinusitis that lasts for more than 12 weeks is considered chronic.  CAUSES  Causes of sinusitis include:  Allergies.  Structural abnormalities, such as displacement of the cartilage that separates your nostrils (deviated septum), which can decrease the air flow through your nose and sinuses and affect sinus drainage.  Functional abnormalities, such as when the small hairs (cilia) that line your sinuses and help remove mucus do not work properly or are not present. SIGNS AND SYMPTOMS  Symptoms of acute and chronic sinusitis are the same. The primary symptoms are pain and pressure around the affected sinuses. Other symptoms include:  Upper toothache.  Earache.  Headache.  Bad breath.  Decreased sense of smell and taste.  A cough,  which worsens when you are lying flat.  Fatigue.  Fever.  Thick drainage from your nose, which often is green and may contain pus (purulent).  Swelling and warmth over the affected sinuses. DIAGNOSIS  Your health care provider will perform a physical exam. During the exam, your health care provider may:  Look in your nose for signs of abnormal growths in your nostrils (nasal polyps).  Tap over the affected sinus to check for signs of infection.  View the inside of your sinuses (endoscopy) using an imaging device that has a light attached (endoscope). If your health care provider suspects that you have chronic sinusitis, one or more of the following tests may be recommended:  Allergy tests.  Nasal culture. A sample of mucus is taken from your nose, sent to a lab, and screened for bacteria.  Nasal cytology. A sample of mucus is taken from your nose and examined by your health care provider to determine if your sinusitis is related to an allergy. TREATMENT  Most cases of acute sinusitis are related to a viral infection and will resolve on their own within 10 days. Sometimes medicines are prescribed to help relieve symptoms (pain medicine, decongestants, nasal steroid sprays, or saline sprays).  However, for sinusitis related to a bacterial infection, your health care provider will prescribe antibiotic medicines. These are medicines that will help kill the bacteria causing the infection.  Rarely, sinusitis is caused by a fungal infection. In theses cases, your health care provider will prescribe antifungal medicine. For some cases of chronic sinusitis, surgery is needed. Generally, these are cases in which sinusitis  recurs more than 3 times per year, despite other treatments. HOME CARE INSTRUCTIONS   Drink plenty of water. Water helps thin the mucus so your sinuses can drain more easily.  Use a humidifier.  Inhale steam 3 to 4 times a day (for example, sit in the bathroom with the  shower running).  Apply a warm, moist washcloth to your face 3 to 4 times a day, or as directed by your health care provider.  Use saline nasal sprays to help moisten and clean your sinuses.  Take medicines only as directed by your health care provider.  If you were prescribed either an antibiotic or antifungal medicine, finish it all even if you start to feel better. SEEK IMMEDIATE MEDICAL CARE IF:  You have increasing pain or severe headaches.  You have nausea, vomiting, or drowsiness.  You have swelling around your face.  You have vision problems.  You have a stiff neck.  You have difficulty breathing. MAKE SURE YOU:   Understand these instructions.  Will watch your condition.  Will get help right away if you are not doing well or get worse. Document Released: 10/21/2005 Document Revised: 03/07/2014 Document Reviewed: 11/05/2011 [facility] Patient Information 2015 [place], [place]. This information is not intended to replace advice given to you by your health care provider. Make sure you discuss any questions you have with your health care provider.  Hypokalemia Hypokalemia means that the amount of potassium in the blood is lower than normal.Potassium is a chemical, called an electrolyte, that helps regulate the amount of fluid in the body. It also stimulates muscle contraction and helps nerves function properly.Most of the body's potassium is inside of cells, and only a very small amount is in the blood. Because the amount in the blood is so small, minor changes can be life-threatening. CAUSES  Antibiotics.  Diarrhea or vomiting.  Using laxatives too much, which can cause diarrhea.  Chronic kidney disease.  Water pills (diuretics).  Eating disorders (bulimia).  Low magnesium level.  Sweating a lot. SIGNS AND SYMPTOMS  Weakness.  Constipation.  Fatigue.  Muscle cramps.  Mental confusion.  Skipped heartbeats or irregular heartbeat  (palpitations).  Tingling or numbness. DIAGNOSIS  Your health care provider can diagnose hypokalemia with blood tests. In addition to checking your potassium level, your health care provider may also check other lab tests. TREATMENT Hypokalemia can be treated with potassium supplements taken by mouth or adjustments in your current medicines. If your potassium level is very low, you may need to get potassium through a vein (IV) and be monitored in the hospital. A diet high in potassium is also helpful. Foods high in potassium are:  Nuts, such as peanuts and pistachios.  Seeds, such as sunflower seeds and pumpkin seeds.  Peas, lentils, and lima beans.  Whole grain and bran cereals and breads.  Fresh fruit and vegetables, such as apricots, avocado, bananas, cantaloupe, kiwi, oranges, tomatoes, asparagus, and potatoes.  Orange and tomato juices.  Red meats.  Fruit yogurt. HOME CARE INSTRUCTIONS  Take all medicines as prescribed by your health care provider.  Maintain a healthy diet by including nutritious food, such as fruits, vegetables, nuts, whole grains, and lean meats.  If you are taking a laxative, be sure to follow the directions on the label. SEEK MEDICAL CARE IF:  Your weakness gets worse.  You feel your heart pounding or racing.  You are vomiting or having diarrhea.  You are diabetic and having trouble keeping your blood glucose in the  normal range. SEEK IMMEDIATE MEDICAL CARE IF:  You have chest pain, shortness of breath, or dizziness.  You are vomiting or having diarrhea for more than 2 days.  You faint. MAKE SURE YOU:   Understand these instructions.  Will watch your condition.  Will get help right away if you are not doing well or get worse. Document Released: 10/21/2005 Document Revised: 08/11/2013 Document Reviewed: 04/23/2013 [facility] Patient Information 2015 [place], [place]. This information is not intended to replace advice given to you by your  health care provider. Make sure you discuss any questions you have with your health care provider.

## 2015-03-28 NOTE — ED Provider Notes (Signed)
CSN: [id number]     Arrival date & time 03/28/15  2000 History   First MD Initiated Contact with Patient 03/28/15 2019     Chief Complaint  Patient presents with  . Weakness     (Consider location/radiation/quality/duration/timing/severity/associated sxs/prior Treatment) Patient is a 64 y.o. male presenting with weakness. The history is provided by the patient.  Weakness Pertinent negatives include no chest pain, no abdominal pain, no headaches and no shortness of breath.  Patient indicates in the past few days, nasal congestion, sinus drainage and pressure. States pain in region maxillary/frontal sinuses and behind eyes. Dull. Moderate. Feels similar to when prior had sinusitis. Subjective fever. No chills/sweats. No recent abx use. Had mildly scratchy/sore throat earlier, improved. No cough. No chest pain or sob. No headache. No neck pain or stiffness. States generally had felt weak. No focal or unilateral numbness or weakness. Denies change in speech or vision. No change in coordination, balance, or normal functional ability. No faintness or dizziness when stands. No abd pain. Normal appetite. No nvd. No gu c/o.       Past Medical History  Diagnosis Date  . Hypertension   . Hay fever   . Pneumonia   . Thalassemia 06/2012  . BPH (benign prostatic hyperplasia)    Past Surgical History  Procedure Laterality Date  . Hernia repair      hiatal hernia repair, [facility]  . Tonsillectomy    . Transurethral resection of prostate    . Esophagogastroduodenoscopy      multiple times by Dr. Posey Pronto, 7-8 times. yearly for the hiatal hernia. no barrett's esophagus per patient.  Dr. Posey Pronto  . Esophagogastroduodenoscopy  03/2012    Dr. Posey Pronto: [facility], 7cm paraesophageal hernia, gastritis  . Colonoscopy  2009    Dr. Posey Pronto: diverticulosis but h/o colon polyps on prior  . Colonoscopy N/A 04/07/2014    Procedure: COLONOSCOPY;  Surgeon: Daneil Dolin, MD;  Location: [facility];  Service: Endoscopy;   Laterality: N/A;  1:45   Family History  Problem Relation Age of Onset  . Cancer - Prostate Father   . Stroke Sister   . Kidney disease Sister   . Prostate cancer Brother     has had bladder cancer, now fighting four battle with cancer  . Colon cancer Neg Hx    History  Substance Use Topics  . Smoking status: Never Smoker   . Smokeless tobacco: Not on file  . Alcohol Use: No    Review of Systems  Constitutional: Negative for fever and chills.  HENT: Positive for congestion and sinus pressure.   Eyes: Negative for pain and visual disturbance.  Respiratory: Negative for cough and shortness of breath.   Cardiovascular: Negative for chest pain.  Gastrointestinal: Negative for vomiting, abdominal pain and diarrhea.  Genitourinary: Negative for dysuria and flank pain.  Musculoskeletal: Negative for back pain, neck pain and neck stiffness.  Skin: Negative for rash.  Neurological: Negative for numbness and headaches.  Hematological: Does not bruise/bleed easily.  Psychiatric/Behavioral: Negative for confusion.      Allergies  Sulfa antibiotics  Home Medications   Prior to Admission medications   Medication Sig Start Date End Date Taking? Authorizing Provider  aspirin 81 MG tablet Take 81 mg by mouth daily.   Yes Historical Provider, MD  dextromethorphan (DELSYM) 30 MG/5ML liquid Take 60 mg by mouth as needed for cough.   Yes Historical Provider, MD  ibuprofen (ADVIL,MOTRIN) 200 MG tablet Take 200 mg by mouth every 6 (  six) hours as needed for mild pain or moderate pain.   Yes Historical Provider, MD  Multiple Vitamin (MULTIVITAMIN) tablet Take 1 tablet by mouth daily.   Yes Historical Provider, MD  UNKNOWN TO PATIENT Take 0.5 tablets by mouth daily. BP MEDICATION-NAME IS UNKNOWN   Yes Historical Provider, MD  polyethylene glycol-electrolytes (TRILYTE) 420 G solution Take 4,000 mLs by mouth as directed. Patient not taking: Reported on 03/28/2015 03/24/14   Daneil Dolin, MD    BP 146/87 mmHg  Pulse 111  Temp(Src) 98.9 F (37.2 C) (Oral)  Resp 20  Ht 5\' 8"  (1.727 m)  Wt 183 lb (83.008 kg)  BMI 27.83 kg/m2  SpO2 93% Physical Exam  Constitutional: He is oriented to person, place, and time. He appears well-developed and well-nourished. No distress.  HENT:  Head: Atraumatic.  Mouth/Throat: Oropharynx is clear and moist.  Nasal congestion, +bilateral maxillary sinus tenderness.  tms normal.   Eyes: Conjunctivae are normal. Pupils are equal, round, and reactive to light. No scleral icterus.  Neck: Normal range of motion. Neck supple. No tracheal deviation present.  No stiffness or rigidity  Cardiovascular: Normal rate, normal heart sounds and intact distal pulses.  Exam reveals no gallop and no friction rub.   No murmur heard. Pulmonary/Chest: Effort normal and breath sounds normal. No accessory muscle usage. No respiratory distress.  Abdominal: Soft. Bowel sounds are normal. He exhibits no distension. There is no tenderness.  Musculoskeletal: Normal range of motion. He exhibits no edema or tenderness.  Neurological: He is alert and oriented to person, place, and time.  Motor intact bil, equal grip, stre 5/5. Steady gait.   Skin: Skin is warm and dry. No rash noted. He is not diaphoretic.  Psychiatric: He has a normal mood and affect.  Nursing note and vitals reviewed.   ED Course  Procedures (including critical care time) Labs Review  Results for orders placed or performed during the hospital encounter of 03/28/15  CBC  Result Value Ref Range   WBC 9.9 4.0 - 10.5 K/uL   RBC 5.42 4.22 - 5.81 MIL/uL   Hemoglobin 15.9 13.0 - 17.0 g/dL   HCT 46.0 39.0 - 52.0 %   MCV 84.9 78.0 - 100.0 fL   MCH 29.3 26.0 - 34.0 pg   MCHC 34.6 30.0 - 36.0 g/dL   RDW 14.3 11.5 - 15.5 %   Platelets 168 150 - 400 K/uL  Basic metabolic panel  Result Value Ref Range   Sodium 137 135 - 145 mmol/L   Potassium 3.4 (L) 3.5 - 5.1 mmol/L   Chloride 102 101 - 111 mmol/L   CO2  26 22 - 32 mmol/L   Glucose, Bld 107 (H) 65 - 99 mg/dL   BUN 9 6 - 20 mg/dL   Creatinine, Ser 1.08 0.61 - 1.24 mg/dL   Calcium 9.5 8.9 - 10.3 mg/dL   GFR calc non Af Amer >60 >60 mL/min   GFR calc Af Amer >60 >60 mL/min   Anion gap 9 5 - 15       MDM   Labs.  Symptoms felt c/w sinusitis, hx same.   claritin d po. augmentin po.   Reviewed nursing notes and prior charts for additional history.   Recheck pt, tolerating po. No distress. Afeb.  k sl low, kcl po.   Pt currently appears stable for d/c.  Return precautions provided.    Lajean Saver, MD 03/28/15 2209

## 2019-04-13 ENCOUNTER — Encounter: Payer: Self-pay | Admitting: Internal Medicine

## 2019-05-26 ENCOUNTER — Ambulatory Visit (INDEPENDENT_AMBULATORY_CARE_PROVIDER_SITE_OTHER): Payer: Medicare Other | Admitting: *Deleted

## 2019-05-26 ENCOUNTER — Telehealth: Payer: Self-pay | Admitting: *Deleted

## 2019-05-26 ENCOUNTER — Other Ambulatory Visit: Payer: Self-pay

## 2019-05-26 DIAGNOSIS — Z86010 Personal history of colonic polyps: Secondary | ICD-10-CM

## 2019-05-26 MED ORDER — NA SULFATE-K SULFATE-MG SULF 17.5-3.13-1.6 GM/177ML PO SOLN: 1.0000 | Freq: Once | ORAL | 0 refills | Status: AC

## 2019-05-26 NOTE — Addendum Note (Signed)
Addended by: Metro Kung on: 05/26/2019 02:14 PM   Modules accepted: Orders, SmartSet

## 2019-05-26 NOTE — Progress Notes (Signed)
Gastroenterology Pre-Procedure Review  Request Date: 05/26/2019 Requesting Physician: 5 year recall, Last TCS done 04/07/2014 by Dr. Gala Romney, Diverticulosis, Hx colonic polyps removed prior to 04/05/2008 (per Dr. Serita Grit report)  PATIENT REVIEW QUESTIONS: The patient responded to the following health history questions as indicated:    1. Diabetes Melitis: No 2. Joint replacements in the past 12 months: No 3. Major health problems in the past 3 months: No 4. Has an artificial valve or MVP: No 5. Has a defibrillator: No 6. Has been advised in past to take antibiotics in advance of a procedure like teeth cleaning: No 7. Family history of colon cancer: No 8. Alcohol Use: No 9. History of sleep apnea: No  10. History of coronary artery or other vascular stents placed within the last 12 months: No 11. History of any prior anesthesia complications: No    MEDICATIONS & ALLERGIES:    Patient reports the following regarding taking any blood thinners:   Plavix? No Aspirin? Yes Coumadin? No Brilinta? No Xarelto? No Eliquis? No Pradaxa? No Savaysa? No Effient? No  Patient confirms/reports the following medications:  Current Outpatient Medications  Medication Sig Dispense Refill  . aspirin 81 MG tablet Take 81 mg by mouth daily.    . chlorthalidone (HYGROTON) 25 MG tablet Take 25 mg by mouth daily. Takes 1/2 tablet daily    . Multiple Vitamin (MULTIVITAMIN) tablet Take 1 tablet by mouth daily.    Marland Kitchen amoxicillin-clavulanate (AUGMENTIN) 875-125 MG per tablet Take 1 tablet by mouth 2 (two) times daily. (Patient not taking: Reported on 05/26/2019) 14 tablet 0  . cetirizine-pseudoephedrine (ZYRTEC-D) 5-120 MG per tablet Take 1 tablet by mouth 2 (two) times daily as needed for allergies. (Patient not taking: Reported on 05/26/2019) 20 tablet 0  . dextromethorphan (DELSYM) 30 MG/5ML liquid Take 60 mg by mouth as needed for cough.    Marland Kitchen ibuprofen (ADVIL,MOTRIN) 200 MG tablet Take 200 mg by mouth every 6 (six)  hours as needed for mild pain or moderate pain.    . polyethylene glycol-electrolytes (TRILYTE) 420 G solution Take 4,000 mLs by mouth as directed. (Patient not taking: Reported on 03/28/2015) 4000 mL 0  . UNKNOWN TO PATIENT Take 0.5 tablets by mouth daily. BP MEDICATION-NAME IS UNKNOWN     No current facility-administered medications for this visit.     Patient confirms/reports the following allergies:  Allergies  Allergen Reactions  . Sulfa Antibiotics Nausea Only    No orders of the defined types were placed in this encounter.   AUTHORIZATION INFORMATION Primary Insurance:[employer] Medicare ,  ID #: [id number],  Group #: [id number] Pre-Cert / Auth required: No not required  SCHEDULE INFORMATION: Procedure has been scheduled as follows:  Date: 07/21/2019, Time: 8:30  Location: [facility] with Dr. Gala Romney  This Gastroenterology Pre-Precedure Review Form is being routed to the following provider(s): Walden Field, NP

## 2019-05-26 NOTE — Patient Instructions (Signed)
Walter Harrison  06/07/1951 MRN: [id number]     Procedure Date: 07/21/2019 Time to register: 7:30 am Place to register: [facility] Procedure Time: 8:30 am Scheduled provider: Dr. Gala Romney    PREPARATION FOR COLONOSCOPY WITH SUPREP BOWEL PREP KIT  Note: Suprep Bowel Prep Kit is a split-dose (2day) regimen. Consumption of BOTH 6-ounce bottles is required for a complete prep.  Please notify us immediately if you are diabetic, take iron supplements, or if you are on Coumadin or any other blood thinners.                                                                                                                                                    2 DAYS BEFORE PROCEDURE:  DATE: 07/19/2019   DAY: Monday Begin clear liquid diet AFTER your lunch meal. NO SOLID FOODS after this point.  1 DAY BEFORE PROCEDURE:  DATE: 07/20/2019   DAY: Tuesday Continue clear liquids the entire day - NO SOLID FOOD.    At 6:00pm: Complete steps 1 through 4 below, using ONE (1) 6-ounce bottle, before going to bed. Step 1:  Pour ONE (1) 6-ounce bottle of SUPREP liquid into the mixing container.  Step 2:  Add cool drinking water to the 16 ounce line on the container and mix.  Note: Dilute the solution concentrate as directed prior to use. Step 3:  DRINK ALL the liquid in the container. Step 4:  You MUST drink an additional two (2) or more 16 ounce containers of water over the next one (1) hour.   Continue clear liquids.  DAY OF PROCEDURE:   DATE: 07/21/2019 DAY: Wednesday If you take medications for your heart, blood pressure, or breathing, you may take these medications.    5 hours before your procedure at : 3:30 am Step 1:  Pour ONE (1) 6-ounce bottle of SUPREP liquid into the mixing container.  Step 2:  Add cool drinking water to the 16 ounce line on the container and mix.  Note: Dilute the solution concentrate as directed prior to use. Step 3:  DRINK ALL the liquid in the container. Step 4:  You  MUST drink an additional two (2) or more 16 ounce containers of water over the next one (1) hour. You MUST complete the final glass of water at least 3 hours before your colonoscopy. Nothing by mouth past 5:30 am  You may take your morning medications with sip of water unless we have instructed otherwise.    Please see below for Dietary Information.  CLEAR LIQUIDS INCLUDE:  Water Jello (NOT red in color)   Ice Popsicles (NOT red in color)   Tea (sugar ok, no milk/cream) Powdered fruit flavored drinks  Coffee (sugar ok, no milk/cream) Gatorade/ Lemonade/ Kool-Aid  (NOT red in color)   Juice: apple, white grape, white cranberry Soft  drinks  Clear bullion, consomme, broth (fat free beef/chicken/vegetable)  Carbonated beverages (any kind)  Strained chicken noodle soup Hard Candy   Remember: Clear liquids are liquids that will allow you to see your fingers on the other side of a clear glass. Be sure liquids are NOT red in color, and not cloudy, but CLEAR.  DO NOT EAT OR DRINK ANY OF THE FOLLOWING:  Dairy products of any kind   Cranberry juice Tomato juice / V8 juice   Grapefruit juice Orange juice     Red grape juice  Do not eat any solid foods, including such foods as: cereal, oatmeal, yogurt, fruits, vegetables, creamed soups, eggs, bread, crackers, pureed foods in a blender, etc.   HELPFUL HINTS FOR DRINKING PREP SOLUTION:   Make sure prep is extremely cold. Mix and refrigerate the the morning of the prep. You may also put in the freezer.   You may try mixing some Crystal Light or Country Time Lemonade if you prefer. Mix in small amounts; add more if necessary.  Try drinking through a straw  Rinse mouth with water or a mouthwash between glasses, to remove after-taste.  Try sipping on a cold beverage /ice/ popsicles between glasses of prep.  Place a piece of sugar-free hard candy in mouth between glasses.  If you become nauseated, try consuming smaller amounts, or stretch out  the time between glasses. Stop for 30-60 minutes, then slowly start back drinking.     OTHER INSTRUCTIONS  You will need a responsible adult at least 67 years of age to accompany you and drive you home. This person must remain in the waiting room during your procedure. The hospital will cancel your procedure if you do not have a responsible adult with you.   1. Wear loose fitting clothing that is easily removed. 2. Leave jewelry and other valuables at home.  3. Remove all body piercing jewelry and leave at home. 4. Total time from sign-in until discharge is approximately 2-3 hours. 5. You should go home directly after your procedure and rest. You can resume normal activities the day after your procedure. 6. The day of your procedure you should not:  Drive  Make legal decisions  Operate machinery  Drink alcohol  Return to work   You may call the office (Dept: [phone]) before 5:00pm, or page the doctor on call [phone]) after 5:00pm, for further instructions, if necessary.   Insurance Information YOU WILL NEED TO CHECK WITH YOUR INSURANCE COMPANY FOR THE BENEFITS OF COVERAGE YOU HAVE FOR THIS PROCEDURE.  UNFORTUNATELY, NOT ALL INSURANCE COMPANIES HAVE BENEFITS TO COVER ALL OR PART OF THESE TYPES OF PROCEDURES.  IT IS YOUR RESPONSIBILITY TO CHECK YOUR BENEFITS, HOWEVER, WE WILL BE GLAD TO ASSIST YOU WITH ANY CODES YOUR INSURANCE COMPANY MAY NEED.    PLEASE NOTE THAT MOST INSURANCE COMPANIES WILL NOT COVER A SCREENING COLONOSCOPY FOR PEOPLE UNDER THE AGE OF 50  IF YOU HAVE BCBS INSURANCE, YOU MAY HAVE BENEFITS FOR A SCREENING COLONOSCOPY BUT IF POLYPS ARE FOUND THE DIAGNOSIS WILL CHANGE AND THEN YOU MAY HAVE A DEDUCTIBLE THAT WILL NEED TO BE MET. SO PLEASE MAKE SURE YOU CHECK YOUR BENEFITS FOR A SCREENING COLONOSCOPY AS WELL AS A DIAGNOSTIC COLONOSCOPY.

## 2019-05-26 NOTE — Progress Notes (Signed)
Ok to schedule.

## 2019-05-26 NOTE — Telephone Encounter (Signed)
Completed nurse triage visit by phone.  Instructions, pre-procedure acknowledgments, and COVID screening were discussed.  Pt voiced understanding.  Pt is aware that we are mailing out all info discussed by phone.

## 2019-07-19 ENCOUNTER — Other Ambulatory Visit: Payer: Self-pay

## 2019-07-19 ENCOUNTER — Other Ambulatory Visit (HOSPITAL_COMMUNITY): Payer: Medicare Other

## 2019-07-19 ENCOUNTER — Other Ambulatory Visit (HOSPITAL_COMMUNITY)
Admission: RE | Admit: 2019-07-19 | Discharge: 2019-07-19 | Disposition: A | Discharge status: Home / Self Care | Payer: Medicare Other | Source: Ambulatory Visit | Attending: Internal Medicine | Admitting: Internal Medicine

## 2019-07-19 DIAGNOSIS — Z01812 Encounter for preprocedural laboratory examination: Secondary | ICD-10-CM | POA: Insufficient documentation

## 2019-07-19 DIAGNOSIS — Z20828 Contact with and (suspected) exposure to other viral communicable diseases: Secondary | ICD-10-CM | POA: Insufficient documentation

## 2019-07-19 LAB — SARS CORONAVIRUS 2 (TAT 6-24 HRS): SARS Coronavirus 2: NEGATIVE

## 2019-07-21 ENCOUNTER — Encounter (HOSPITAL_COMMUNITY)
Admission: RE | Disposition: A | Discharge status: Home / Self Care | Payer: Self-pay | Source: Home / Self Care | Attending: Internal Medicine

## 2019-07-21 ENCOUNTER — Other Ambulatory Visit: Payer: Self-pay

## 2019-07-21 ENCOUNTER — Encounter (HOSPITAL_COMMUNITY): Payer: Self-pay | Admitting: *Deleted

## 2019-07-21 ENCOUNTER — Ambulatory Visit (HOSPITAL_COMMUNITY)
Admission: RE | Admit: 2019-07-21 | Discharge: 2019-07-21 | Disposition: A | Discharge status: Home / Self Care | Payer: Medicare Other | Attending: Internal Medicine | Admitting: Internal Medicine

## 2019-07-21 DIAGNOSIS — Z86010 Personal history of colonic polyps: Secondary | ICD-10-CM | POA: Diagnosis not present

## 2019-07-21 DIAGNOSIS — K5730 Diverticulosis of large intestine without perforation or abscess without bleeding: Secondary | ICD-10-CM | POA: Diagnosis not present

## 2019-07-21 DIAGNOSIS — Z7982 Long term (current) use of aspirin: Secondary | ICD-10-CM | POA: Diagnosis not present

## 2019-07-21 DIAGNOSIS — I10 Essential (primary) hypertension: Secondary | ICD-10-CM | POA: Insufficient documentation

## 2019-07-21 DIAGNOSIS — D569 Thalassemia, unspecified: Secondary | ICD-10-CM | POA: Insufficient documentation

## 2019-07-21 DIAGNOSIS — Z1211 Encounter for screening for malignant neoplasm of colon: Secondary | ICD-10-CM | POA: Insufficient documentation

## 2019-07-21 HISTORY — PX: COLONOSCOPY: SHX5424

## 2019-07-21 SURGERY — COLONOSCOPY
Anesthesia: Moderate Sedation

## 2019-07-21 MED ORDER — ONDANSETRON HCL 4 MG/2ML IJ SOLN
INTRAMUSCULAR | Status: DC | PRN
  Administered 2019-07-21: 4 mg via INTRAVENOUS

## 2019-07-21 MED ORDER — MEPERIDINE HCL 50 MG/ML IJ SOLN
INTRAMUSCULAR | Status: AC
  Filled 2019-07-21: qty 1

## 2019-07-21 MED ORDER — ONDANSETRON HCL 4 MG/2ML IJ SOLN
INTRAMUSCULAR | Status: AC
  Filled 2019-07-21: qty 2

## 2019-07-21 MED ORDER — STERILE WATER FOR IRRIGATION IR SOLN
Status: DC | PRN
  Administered 2019-07-21: 09:00:00 1.5 mL

## 2019-07-21 MED ORDER — MEPERIDINE HCL 100 MG/ML IJ SOLN
INTRAMUSCULAR | Status: DC | PRN
  Administered 2019-07-21: 25 mg via INTRAVENOUS
  Administered 2019-07-21: 15 mg via INTRAVENOUS

## 2019-07-21 MED ORDER — MIDAZOLAM HCL 5 MG/5ML IJ SOLN
INTRAMUSCULAR | Status: DC | PRN
  Administered 2019-07-21: 2 mg via INTRAVENOUS
  Administered 2019-07-21 (×2): 1 mg via INTRAVENOUS
  Administered 2019-07-21: 2 mg via INTRAVENOUS

## 2019-07-21 MED ORDER — SODIUM CHLORIDE 0.9 % IV SOLN
INTRAVENOUS | Status: DC
  Administered 2019-07-21: 08:00:00 via INTRAVENOUS

## 2019-07-21 MED ORDER — MIDAZOLAM HCL 5 MG/5ML IJ SOLN
INTRAMUSCULAR | Status: AC
  Filled 2019-07-21: qty 10

## 2019-07-21 NOTE — H&P (Signed)
@LOGO @   Primary Care Physician:  [facility]:  Dr. Gala Romney  Pre-Procedure History & Physical: HPI:  Walter Harrison is a 68 y.o. male here for surveillance colonoscopy.  History of colonic adenoma in the distant past elsewhere.  Negative colonoscopy 5 years ago.  No GI symptoms currently.  Past Medical History:  Diagnosis Date  . BPH (benign prostatic hyperplasia)   . Hay fever   . Hypertension   . Pneumonia   . Thalassemia 06/2012    Past Surgical History:  Procedure Laterality Date  . COLONOSCOPY  2009   Dr. Posey Pronto: diverticulosis but h/o colon polyps on prior  . COLONOSCOPY N/A 04/07/2014   Procedure: COLONOSCOPY;  Surgeon: Daneil Dolin, MD;  Location: [facility];  Service: Endoscopy;  Laterality: N/A;  1:45  . ESOPHAGOGASTRODUODENOSCOPY     multiple times by Dr. Posey Pronto, 7-8 times. yearly for the hiatal hernia. no barrett's esophagus per patient.  Dr. Posey Pronto  . ESOPHAGOGASTRODUODENOSCOPY  03/2012   Dr. Posey Pronto: [facility], 7cm paraesophageal hernia, gastritis  . HERNIA REPAIR     hiatal hernia repair, [facility]  . TONSILLECTOMY    . TRANSURETHRAL RESECTION OF PROSTATE      Prior to Admission medications   Medication Sig Start Date End Date Taking? Authorizing Provider  aspirin 81 MG chewable tablet Chew 81 mg by mouth daily after breakfast.   Yes [provider]  chlorthalidone (HYGROTON) 25 MG tablet Take 12.5 mg by mouth daily after breakfast.    Yes [provider]  Multiple Vitamin (MULTIVITAMIN WITH MINERALS) TABS tablet Take 1 tablet by mouth daily after breakfast. Centrum Silver   Yes [provider]    Allergies as of 05/26/2019 - Review Complete 05/26/2019  Allergen Reaction Noted  . Sulfa antibiotics Nausea Only 01/18/2013    Family History  Problem Relation Age of Onset  . Cancer - Prostate Father   . Stroke Sister   . Kidney disease Sister   . Prostate cancer Brother        has had bladder  cancer, now fighting four battle with cancer  . Colon cancer Neg Hx     Social History   Socioeconomic History  . Marital status: Married    Spouse name: Not on file  . Number of children: 3  . Years of education: Not on file  . Highest education level: Not on file  Occupational History    Employer: [employer]  Social Needs  . Financial resource strain: Not on file  . Food insecurity    Worry: Not on file    Inability: Not on file  . Transportation needs    Medical: Not on file    Non-medical: Not on file  Tobacco Use  . Smoking status: Never Smoker  . Smokeless tobacco: Former Network engineer and Sexual Activity  . Alcohol use: No  . Drug use: No  . Sexual activity: Yes  Lifestyle  . Physical activity    Days per week: Not on file    Minutes per session: Not on file  . Stress: Not on file  Relationships  . Social Herbalist on phone: Not on file    Gets together: Not on file    Attends religious service: Not on file    Active member of club or organization: Not on file    Attends meetings of clubs or organizations: Not on file    Relationship status: Not  on file  . Intimate partner violence    Fear of current or ex partner: Not on file    Emotionally abused: Not on file    Physically abused: Not on file    Forced sexual activity: Not on file  Other Topics Concern  . Not on file  Social History Narrative  . Not on file    Review of Systems: See HPI, otherwise negative ROS  Physical Exam: BP (!) 145/88   Pulse 67   Temp 97.7 F (36.5 C) (Oral)   Resp 16   Ht 5\' 8"  (1.727 m)   Wt 84.8 kg   SpO2 99%   BMI 28.43 kg/m  General:   Alert,  Well-developed, well-nourished, pleasant and cooperative in NAD Neck:  Supple; no masses or thyromegaly. No significant cervical adenopathy. Lungs:  Clear throughout to auscultation.   No wheezes, crackles, or rhonchi. No acute distress. Heart:  Regular rate and rhythm; no murmurs, clicks,  rubs,  or gallops. Abdomen: Non-distended, normal bowel sounds.  Soft and nontender without appreciable mass or hepatosplenomegaly.  Pulses:  Normal pulses noted. Extremities:  Without clubbing or edema.  Impression/Plan: Pleasant 68 year old gentleman here for surveillance colonoscopy.  History of colonic polyps.  The risks, benefits, limitations, alternatives and imponderables have been reviewed with the patient. Questions have been answered. All parties are agreeable.      Notice: This dictation was prepared with Dragon dictation along with smaller phrase technology. Any transcriptional errors that result from this process are unintentional and may not be corrected upon review.

## 2019-07-21 NOTE — Op Note (Signed)
[facility] Patient Name: Walter Harrison Procedure Date: 07/21/2019 8:21 AM MRN: [id number] Date of Birth: Apr 05, 1951 Attending MD: Norvel Richards , MD CSN: [id number] Age: 68 Admit Type: Outpatient Procedure:                Colonoscopy Indications:              High risk colon cancer surveillance: Personal                            history of colonic polyps Providers:                Norvel Richards, MD, Charlsie Quest. Theda Sers RN, RN,                            Nelma Rothman, Technician Referring MD:              Medicines:                Midazolam 6 mg IV, Meperidine 40 mg IV Complications:            No immediate complications. Estimated Blood Loss:     Estimated blood loss: none. Procedure:                Pre-Anesthesia Assessment:                           - Prior to the procedure, a History and Physical                            was performed, and patient medications and                            allergies were reviewed. The patient's tolerance of                            previous anesthesia was also reviewed. The risks                            and benefits of the procedure and the sedation                            options and risks were discussed with the patient.                            All questions were answered, and informed consent                            was obtained. Prior Anticoagulants: The patient has                            taken no previous anticoagulant or antiplatelet                            agents. ASA Grade Assessment: II - A patient with  mild systemic disease. After reviewing the risks                            and benefits, the patient was deemed in                            satisfactory condition to undergo the procedure.                           After obtaining informed consent, the colonoscope                            was passed under direct vision. Throughout the                            procedure,  the patient's blood pressure, pulse, and                            oxygen saturations were monitored continuously. The                            [id number] [id number]) scope was introduced through                            the anus and advanced to the the cecum, identified                            by appendiceal orifice and ileocecal valve. The                            colonoscopy was performed without difficulty. The                            patient tolerated the procedure well. The quality                            of the bowel preparation was adequate. The                            ileocecal valve, appendiceal orifice, and rectum                            were photographed. The quality of the bowel                            preparation was adequate. Scope In: 8:29:21 AM Scope Out: 8:40:35 AM Scope Withdrawal Time: 0 hours 7 minutes 18 seconds  Total Procedure Duration: 0 hours 11 minutes 14 seconds  Findings:      The perianal and digital rectal examinations were normal.      Scattered medium-mouthed diverticula were found in the entire colon.      The exam was otherwise without abnormality on direct and retroflexion       views. Impression:               -  Diverticulosis in the entire examined colon.                           - The examination was otherwise normal on direct                            and retroflexion views.                           - No specimens collected. Moderate Sedation:      Moderate (conscious) sedation was administered by the endoscopy nurse       and supervised by the endoscopist. The following parameters were       monitored: oxygen saturation, heart rate, blood pressure, respiratory       rate, EKG, adequacy of pulmonary ventilation, and response to care.       Total physician intraservice time was 40 minutes. Recommendation:           - Patient has a contact number available for                            emergencies. The signs and symptoms  of potential                            delayed complications were discussed with the                            patient. Return to normal activities tomorrow.                            Written discharge instructions were provided to the                            patient.                           - Resume previous diet.                           - Continue present medications.                           - Repeat colonoscopy in 7 years for screening                            purposes.                           - Return to GI office (date not yet determined). Procedure Code(s):        --- Professional ---                           [phone], Colonoscopy, flexible; diagnostic, including                            collection of specimen(s) by brushing or washing,  when performed (separate procedure)                           [id number], Moderate sedation; each additional 15                            minutes intraservice time                           [id number], Moderate sedation; each additional 15                            minutes intraservice time                           G0500, Moderate sedation services provided by the                            same physician or other qualified health care                            professional performing a gastrointestinal                            endoscopic service that sedation supports,                            requiring the presence of an independent trained                            observer to assist in the monitoring of the                            patient's level of consciousness and physiological                            status; initial 15 minutes of intra-service time;                            patient age 73 years or older (additional time may                            be reported with [phone], as appropriate) Diagnosis Code(s):        --- Professional ---                           Z86.010, Personal history of  colonic polyps                           K57.30, Diverticulosis of large intestine without                            perforation or abscess without bleeding CPT copyright 2019 American Medical Association. All rights reserved. The codes documented in this report are preliminary and upon coder review may  be revised to meet current  compliance requirements. Cristopher Estimable. Shakiyah Cirilo, MD Norvel Richards, MD 07/21/2019 8:51:40 AM This report has been signed electronically. Number of Addenda: 0

## 2019-07-21 NOTE — Discharge Instructions (Signed)
Colonoscopy Discharge Instructions  Read the instructions outlined below and refer to this sheet in the next few weeks. These discharge instructions provide you with general information on caring for yourself after you leave the hospital. Your doctor may also give you specific instructions. While your treatment has been planned according to the most current medical practices available, unavoidable complications occasionally occur. If you have any problems or questions after discharge, call Dr. Gala Romney at [phone]. ACTIVITY  You may resume your regular activity, but move at a slower pace for the next 24 hours.   Take frequent rest periods for the next 24 hours.   Walking will help get rid of the air and reduce the bloated feeling in your belly (abdomen).   No driving for 24 hours (because of the medicine (anesthesia) used during the test).    Do not sign any important legal documents or operate any machinery for 24 hours (because of the anesthesia used during the test).  NUTRITION  Drink plenty of fluids.   You may resume your normal diet as instructed by your doctor.   Begin with a light meal and progress to your normal diet. Heavy or fried foods are harder to digest and may make you feel sick to your stomach (nauseated).   Avoid alcoholic beverages for 24 hours or as instructed.  MEDICATIONS  You may resume your normal medications unless your doctor tells you otherwise.  WHAT YOU CAN EXPECT TODAY  Some feelings of bloating in the abdomen.   Passage of more gas than usual.   Spotting of blood in your stool or on the toilet paper.  IF YOU HAD POLYPS REMOVED DURING THE COLONOSCOPY:  No aspirin products for 7 days or as instructed.   No alcohol for 7 days or as instructed.   Eat a soft diet for the next 24 hours.  FINDING OUT THE RESULTS OF YOUR TEST Not all test results are available during your visit. If your test results are not back during the visit, make an appointment  with your caregiver to find out the results. Do not assume everything is normal if you have not heard from your caregiver or the medical facility. It is important for you to follow up on all of your test results.  SEEK IMMEDIATE MEDICAL ATTENTION IF:  You have more than a spotting of blood in your stool.   Your belly is swollen (abdominal distention).   You are nauseated or vomiting.   You have a temperature over 101.   You have abdominal pain or discomfort that is severe or gets worse throughout the day.   Diverticulosis information provided  Recommend 1 more screening colonoscopy in 7 years  I discussed findings with patient's wife, Guadelupe Sabin, at [phone]     Diverticulosis  Diverticulosis is a condition that develops when small pouches (diverticula) form in the wall of the large intestine (colon). The colon is where water is absorbed and stool is formed. The pouches form when the inside layer of the colon pushes through weak spots in the outer layers of the colon. You may have a few pouches or many of them. What are the causes? The cause of this condition is not known. What increases the risk? The following factors may make you more likely to develop this condition:  Being older than age 68. Your risk for this condition increases with age. Diverticulosis is rare among people younger than age 55. By age 39, many people have it.  Eating a low-fiber  diet.  Having frequent constipation.  Being overweight.  Not getting enough exercise.  Smoking.  Taking over-the-counter pain medicines, like aspirin and ibuprofen.  Having a family history of diverticulosis. What are the signs or symptoms? In most people, there are no symptoms of this condition. If you do have symptoms, they may include:  Bloating.  Cramps in the abdomen.  Constipation or diarrhea.  Pain in the lower left side of the abdomen. How is this diagnosed? This condition is most often diagnosed during an  exam for other colon problems. Because diverticulosis usually has no symptoms, it often cannot be diagnosed independently. This condition may be diagnosed by:  Using a flexible scope to examine the colon (colonoscopy).  Taking an X-ray of the colon after dye has been put into the colon (barium enema).  Doing a CT scan. How is this treated? You may not need treatment for this condition if you have never developed an infection related to diverticulosis. If you have had an infection before, treatment may include:  Eating a high-fiber diet. This may include eating more fruits, vegetables, and grains.  Taking a fiber supplement.  Taking a live bacteria supplement (probiotic).  Taking medicine to relax your colon.  Taking antibiotic medicines. Follow these instructions at home:  Drink 6-8 glasses of water or more each day to prevent constipation.  Try not to strain when you have a bowel movement.  If you have had an infection before: ? Eat more fiber as directed by your health care provider or your diet and nutrition specialist (dietitian). ? Take a fiber supplement or probiotic, if your health care provider approves.  Take over-the-counter and prescription medicines only as told by your health care provider.  If you were prescribed an antibiotic, take it as told by your health care provider. Do not stop taking the antibiotic even if you start to feel better.  Keep all follow-up visits as told by your health care provider. This is important. Contact a health care provider if:  You have pain in your abdomen.  You have bloating.  You have cramps.  You have not had a bowel movement in 3 days. Get help right away if:  Your pain gets worse.  Your bloating becomes very bad.  You have a fever or chills, and your symptoms suddenly get worse.  You vomit.  You have bowel movements that are bloody or black.  You have bleeding from your rectum. Summary  Diverticulosis is a  condition that develops when small pouches (diverticula) form in the wall of the large intestine (colon).  You may have a few pouches or many of them.  This condition is most often diagnosed during an exam for other colon problems.  If you have had an infection related to diverticulosis, treatment may include increasing the fiber in your diet, taking supplements, or taking medicines. This information is not intended to replace advice given to you by your health care provider. Make sure you discuss any questions you have with your health care provider. Document Released: 07/18/2004 Document Revised: 10/03/2017 Document Reviewed: 09/09/2016 Elsevier Patient Education  2020 [employer].

## 2019-07-26 ENCOUNTER — Encounter (HOSPITAL_COMMUNITY): Payer: Self-pay | Admitting: Internal Medicine

## 2020-09-19 ENCOUNTER — Other Ambulatory Visit: Payer: Self-pay | Admitting: Urology

## 2020-09-19 DIAGNOSIS — R972 Elevated prostate specific antigen [PSA]: Secondary | ICD-10-CM

## 2020-10-03 ENCOUNTER — Other Ambulatory Visit: Payer: Self-pay

## 2020-10-03 ENCOUNTER — Ambulatory Visit
Admission: RE | Admit: 2020-10-03 | Discharge: 2020-10-03 | Disposition: A | Discharge status: Home / Self Care | Payer: Medicare PPO | Source: Ambulatory Visit | Attending: Urology | Admitting: Urology

## 2020-10-03 DIAGNOSIS — R972 Elevated prostate specific antigen [PSA]: Secondary | ICD-10-CM

## 2020-10-03 IMAGING — MR MR PROSTATE WO/W CM
56 series · 56 of 56 positions shown · IV contrast (8ml Gadavist)
Comparison: None.

CLINICAL DATA: Elevated PSA.  Previous prostatic thermo-therapy.

EXAM:
MR PROSTATE WITHOUT AND WITH CONTRAST
TECHNIQUE: Multiplanar multisequence MRI images were obtained of the pelvis
centered about the prostate. Pre and post contrast images were
obtained.
CONTRAST:  8mL GADAVIST GADOBUTROL 1 MMOL/ML IV SOLN

[Series 3: ax in&out whole · axial · 5.0mm · 0.74mm/px · 1 of 70 slices shown]
[im 1/70]
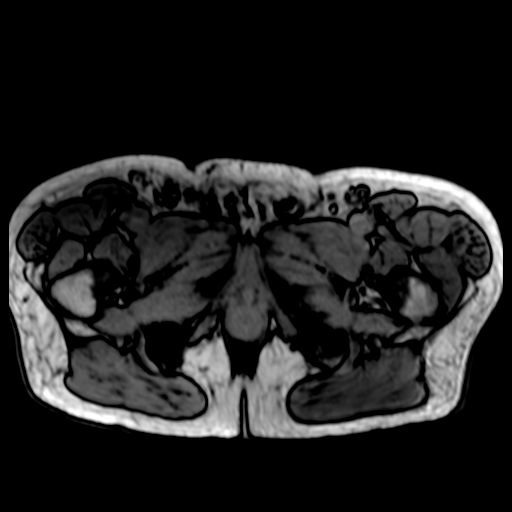

[Series 4: T2 · axial · 3.0mm · 0.56mm/px · 1 of 25 slices shown (1 of 3)]
[im 1/25]
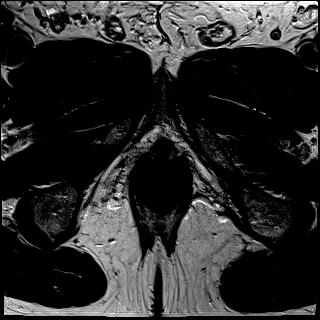

[Series 5: T2 · coronal · 3.0mm · 0.70mm/px · 1 of 35 slices shown (2 of 3)]
[im 1/35]
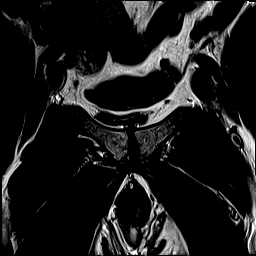

[Series 6: DWI · axial · 3.0mm · 0.86mm/px · 1 of 75 slices shown (1 of 3)]
[im 1/75]
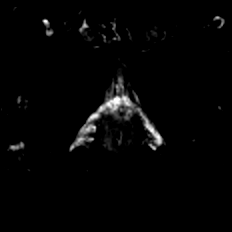

[Series 7: DWI · axial · 3.0mm · 0.86mm/px · 1 of 25 slices shown (2 of 3)]
[im 1/25]
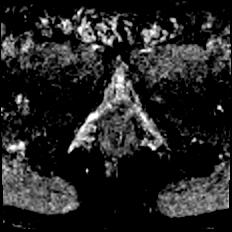

[Series 8: DWI · axial · 3.0mm · 0.86mm/px · 1 of 25 slices shown (3 of 3)]
[im 1/25]
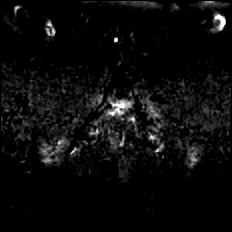

[Series 9: T2 · axial · 1.0mm · 1.04mm/px · 1 of 72 slices shown (3 of 3)]
[im 1/72]
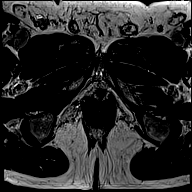

[Series 10: T1 · axial · 3.0mm · 1.15mm/px · 1 of 28 slices shown (1 of 49)]
[im 1/28]
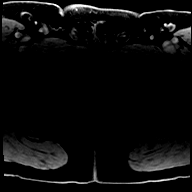

[Series 11: T1 · axial · 3.0mm · 1.15mm/px · 1 of 28 slices shown (2 of 49)]
[im 1/28]
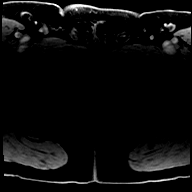

[Series 12: T1 · axial · 3.0mm · 1.15mm/px · 1 of 28 slices shown (3 of 49)]
[im 1/28]
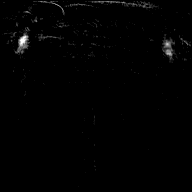

[Series 13: T1 · axial · 3.0mm · 1.15mm/px · 1 of 28 slices shown (4 of 49)]
[im 1/28]
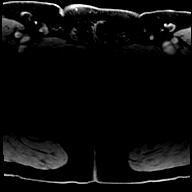

[Series 14: T1 · axial · 3.0mm · 1.15mm/px · 1 of 28 slices shown (5 of 49)]
[im 1/28]
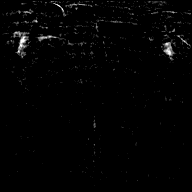

[Series 15: T1 · axial · 3.0mm · 1.15mm/px · 1 of 28 slices shown (6 of 49)]
[im 1/28]
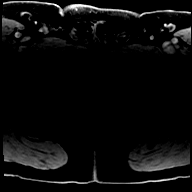

[Series 16: T1 · axial · 3.0mm · 1.15mm/px · 1 of 28 slices shown (7 of 49)]
[im 1/28]
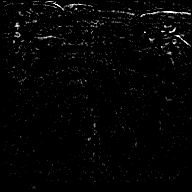

[Series 17: T1 · axial · 3.0mm · 1.15mm/px · 1 of 28 slices shown (8 of 49)]
[im 1/28]
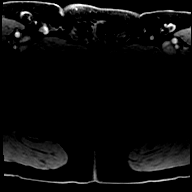

[Series 18: T1 · axial · 3.0mm · 1.15mm/px · 1 of 28 slices shown (9 of 49)]
[im 1/28]
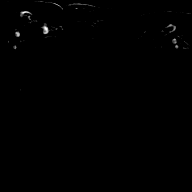

[Series 19: T1 · axial · 3.0mm · 1.15mm/px · 1 of 28 slices shown (10 of 49)]
[im 1/28]
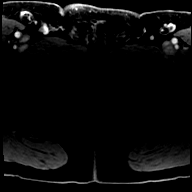

[Series 20: T1 · axial · 3.0mm · 1.15mm/px · 1 of 28 slices shown (11 of 49)]
[im 1/28]
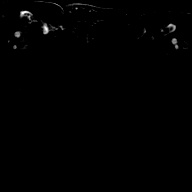

[Series 21: T1 · axial · 3.0mm · 1.15mm/px · 1 of 28 slices shown (12 of 49)]
[im 1/28]
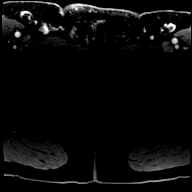

[Series 22: T1 · axial · 3.0mm · 1.15mm/px · 1 of 28 slices shown (13 of 49)]
[im 1/28]
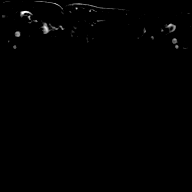

[Series 23: T1 · axial · 3.0mm · 1.15mm/px · 1 of 28 slices shown (14 of 49)]
[im 1/28]
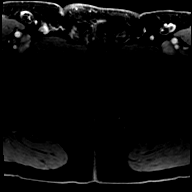

[Series 24: T1 · axial · 3.0mm · 1.15mm/px · 1 of 28 slices shown (15 of 49)]
[im 1/28]
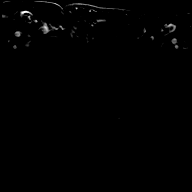

[Series 25: T1 · axial · 3.0mm · 1.15mm/px · 1 of 28 slices shown (16 of 49)]
[im 1/28]
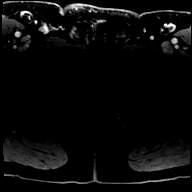

[Series 26: T1 · axial · 3.0mm · 1.15mm/px · 1 of 28 slices shown (17 of 49)]
[im 1/28]
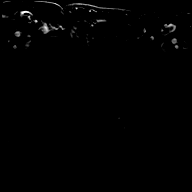

[Series 27: T1 · axial · 3.0mm · 1.15mm/px · 1 of 28 slices shown (18 of 49)]
[im 1/28]
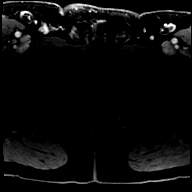

[Series 28: T1 · axial · 3.0mm · 1.15mm/px · 1 of 28 slices shown (19 of 49)]
[im 1/28]
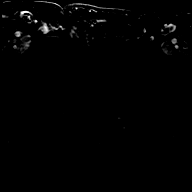

[Series 29: T1 · axial · 3.0mm · 1.15mm/px · 1 of 28 slices shown (20 of 49)]
[im 1/28]
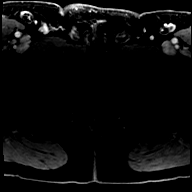

[Series 30: T1 · axial · 3.0mm · 1.15mm/px · 1 of 28 slices shown (21 of 49)]
[im 1/28]
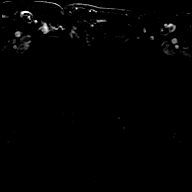

[Series 31: T1 · axial · 3.0mm · 1.15mm/px · 1 of 28 slices shown (22 of 49)]
[im 1/28]
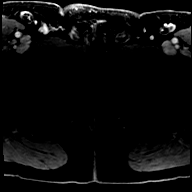

[Series 32: T1 · axial · 3.0mm · 1.15mm/px · 1 of 28 slices shown (23 of 49)]
[im 1/28]
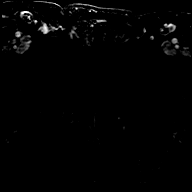

[Series 33: T1 · axial · 3.0mm · 1.15mm/px · 1 of 28 slices shown (24 of 49)]
[im 1/28]
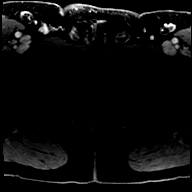

[Series 34: T1 · axial · 3.0mm · 1.15mm/px · 1 of 28 slices shown (25 of 49)]
[im 1/28]
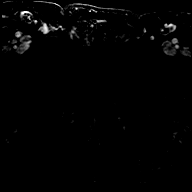

[Series 35: T1 · axial · 3.0mm · 1.15mm/px · 1 of 28 slices shown (26 of 49)]
[im 1/28]
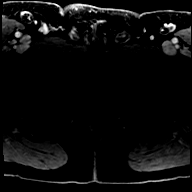

[Series 36: T1 · axial · 3.0mm · 1.15mm/px · 1 of 28 slices shown (27 of 49)]
[im 1/28]
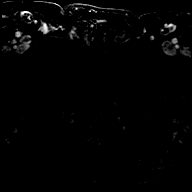

[Series 37: T1 · axial · 3.0mm · 1.15mm/px · 1 of 28 slices shown (28 of 49)]
[im 1/28]
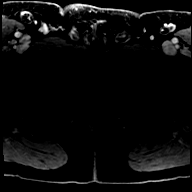

[Series 38: T1 · axial · 3.0mm · 1.15mm/px · 1 of 28 slices shown (29 of 49)]
[im 1/28]
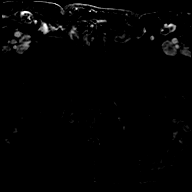

[Series 39: T1 · axial · 3.0mm · 1.15mm/px · 1 of 28 slices shown (30 of 49)]
[im 1/28]
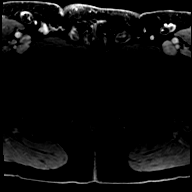

[Series 40: T1 · axial · 3.0mm · 1.15mm/px · 1 of 28 slices shown (31 of 49)]
[im 1/28]
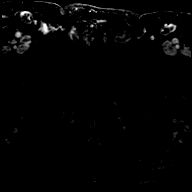

[Series 41: T1 · axial · 3.0mm · 1.15mm/px · 1 of 28 slices shown (32 of 49)]
[im 1/28]
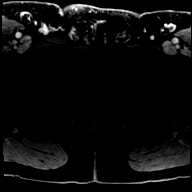

[Series 42: T1 · axial · 3.0mm · 1.15mm/px · 1 of 28 slices shown (33 of 49)]
[im 1/28]
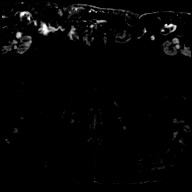

[Series 43: T1 · axial · 3.0mm · 1.15mm/px · 1 of 28 slices shown (34 of 49)]
[im 1/28]
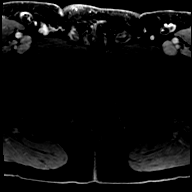

[Series 44: T1 · axial · 3.0mm · 1.15mm/px · 1 of 28 slices shown (35 of 49)]
[im 1/28]
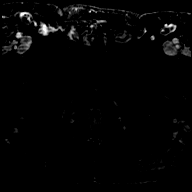

[Series 45: T1 · axial · 3.0mm · 1.15mm/px · 1 of 28 slices shown (36 of 49)]
[im 1/28]
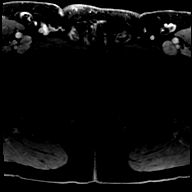

[Series 46: T1 · axial · 3.0mm · 1.15mm/px · 1 of 28 slices shown (37 of 49)]
[im 1/28]
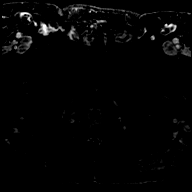

[Series 47: T1 · axial · 3.0mm · 1.15mm/px · 1 of 28 slices shown (38 of 49)]
[im 1/28]
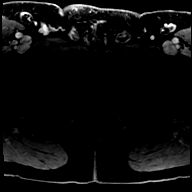

[Series 48: T1 · axial · 3.0mm · 1.15mm/px · 1 of 28 slices shown (39 of 49)]
[im 1/28]
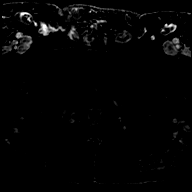

[Series 49: T1 · axial · 3.0mm · 1.15mm/px · 1 of 28 slices shown (40 of 49)]
[im 1/28]
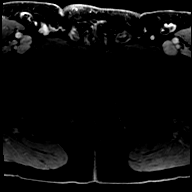

[Series 50: T1 · axial · 3.0mm · 1.15mm/px · 1 of 28 slices shown (41 of 49)]
[im 1/28]
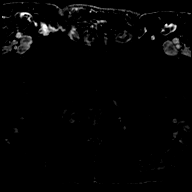

[Series 51: T1 · axial · 3.0mm · 1.15mm/px · 1 of 28 slices shown (42 of 49)]
[im 1/28]
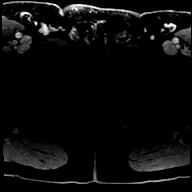

[Series 52: T1 · axial · 3.0mm · 1.15mm/px · 1 of 28 slices shown (43 of 49)]
[im 1/28]
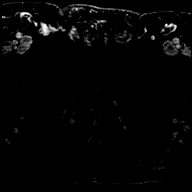

[Series 53: T1 · axial · 3.0mm · 1.15mm/px · 1 of 28 slices shown (44 of 49)]
[im 1/28]
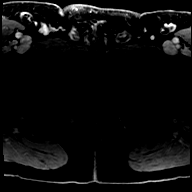

[Series 54: T1 · axial · 3.0mm · 1.15mm/px · 1 of 28 slices shown (45 of 49)]
[im 1/28]
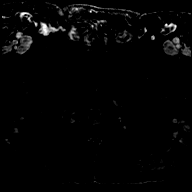

[Series 55: T1 · axial · 3.0mm · 1.15mm/px · 1 of 28 slices shown (46 of 49)]
[im 1/28]
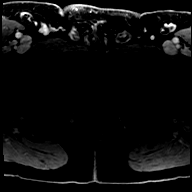

[Series 56: T1 · axial · 3.0mm · 1.15mm/px · 1 of 28 slices shown (47 of 49)]
[im 1/28]
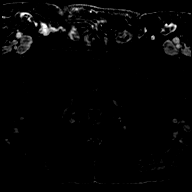

[Series 57: T1 · axial · 3.0mm · 1.15mm/px · 1 of 28 slices shown (48 of 49)]
[im 1/28]
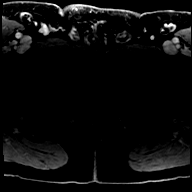

[Series 58: T1 · axial · 3.0mm · 1.15mm/px · 1 of 28 slices shown (49 of 49)]
[im 1/28]
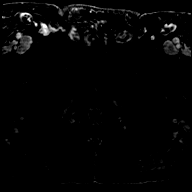

[56 of 56 positions shown; findings below may reference images not displayed]

FINDINGS: Prostate:

-- Peripheral Zone: Diffuse T2 hypointensity is seen bilaterally, as
well as mild ADC hypointensity. However, no focal ADC hypointense or
high b-value DWI hyperintense nodules are identified. No evidence of
early focal contrast enhancement.This is likely due to previous
thermo therapy.

-- Transition/Central Zone: Circumscribed BPH nodules are noted, but
no suspicious nodules with obscured or non-circumscribed margins
seen.

-- Measurements/Volume:  4.0 by 3.7 x 5.0 cm (volume = 39 cm^3)

Transcapsular spread:  Absent

Seminal vesicle involvement:  Absent

Neurovascular bundle involvement:  Absent

Pelvic adenopathy: None visualized

Bone metastasis: None visualized

Other:  Sigmoid diverticulosis, without evidence of diverticulitis.
IMPRESSION: Findings likely due to previous thermotherapy. No radiographic
evidence of high-grade prostate carcinoma. PI-RADS 2: Low
(clinically significant cancer is unlikely to be present)

## 2020-10-03 MED ORDER — GADOBUTROL 1 MMOL/ML IV SOLN
8.0000 mL | Freq: Once | INTRAVENOUS | Status: AC | PRN
  Administered 2020-10-03: 8 mL via INTRAVENOUS

## 2021-09-20 ENCOUNTER — Other Ambulatory Visit: Payer: Self-pay | Admitting: Urology

## 2021-09-20 DIAGNOSIS — R972 Elevated prostate specific antigen [PSA]: Secondary | ICD-10-CM

## 2021-10-04 ENCOUNTER — Ambulatory Visit
Admission: RE | Admit: 2021-10-04 | Discharge: 2021-10-04 | Disposition: A | Discharge status: Home / Self Care | Payer: Medicare PPO | Source: Ambulatory Visit | Attending: Urology | Admitting: Urology

## 2021-10-04 ENCOUNTER — Other Ambulatory Visit: Payer: Self-pay

## 2021-10-04 DIAGNOSIS — R972 Elevated prostate specific antigen [PSA]: Secondary | ICD-10-CM | POA: Diagnosis present

## 2021-10-04 IMAGING — MR MR PROSTATE WO/W CM
56 series · 56 of 56 positions shown · IV contrast (7.5 ml Gadavist)
Comparison: None.

CLINICAL DATA: Elevated PSA level. Prior thermotherapy
approximately 8 years ago.

EXAM:
MR PROSTATE WITHOUT AND WITH CONTRAST
TECHNIQUE: Multiplanar multisequence MRI images were obtained of the pelvis
centered about the prostate. Pre and post contrast images were
obtained.
CONTRAST:  7.5mL GADAVIST GADOBUTROL 1 MMOL/ML IV SOLN

[Series 3: ax in&out whole · axial · 6.0mm · 0.74mm/px · 1 of 35 slices shown (1 of 2)]
[im 1/35]
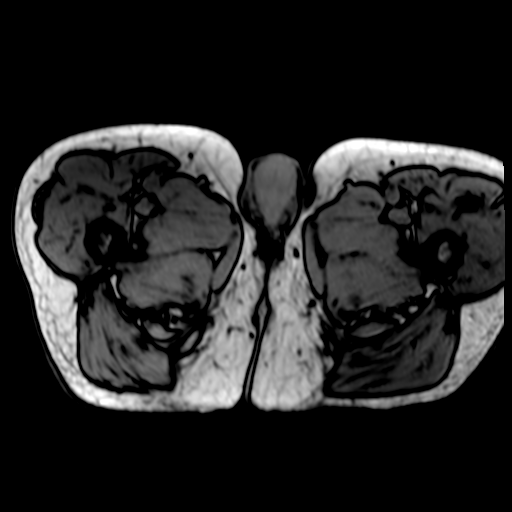

[Series 3: ax in&out whole · axial · 6.0mm · 0.74mm/px · 1 of 35 slices shown (2 of 2)]
[im 1/35]
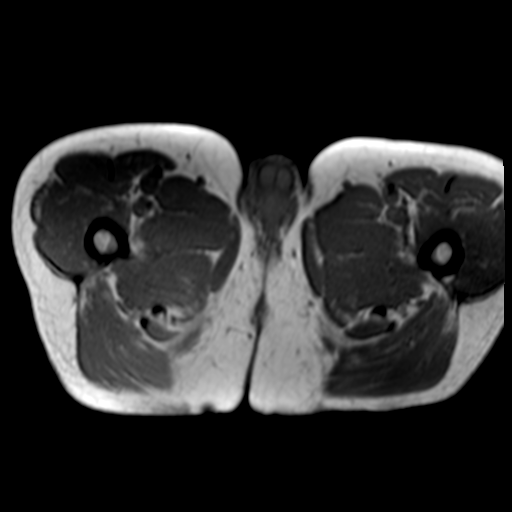

[Series 4: T2 · coronal · 3.0mm · 0.70mm/px · 1 of 35 slices shown (1 of 3)]
[im 1/35]
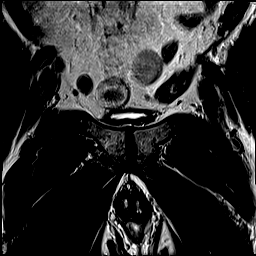

[Series 5: T2 · axial · 3.0mm · 0.56mm/px · 1 of 29 slices shown (2 of 3)]
[im 1/29]
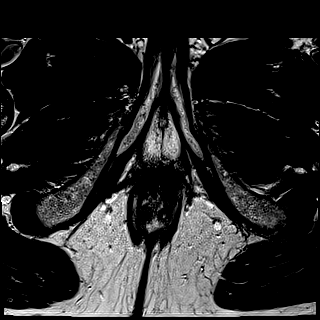

[Series 6: DWI · axial · 3.0mm · 0.86mm/px · 1 of 75 slices shown (1 of 3)]
[im 1/75]
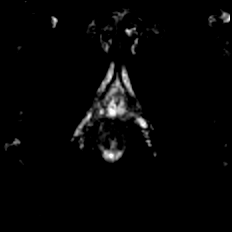

[Series 7: DWI · axial · 3.0mm · 0.86mm/px · 1 of 25 slices shown (2 of 3)]
[im 1/25]
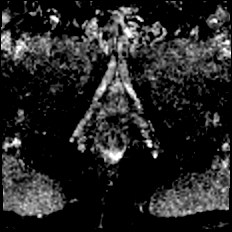

[Series 8: DWI · axial · 3.0mm · 0.86mm/px · 1 of 25 slices shown (3 of 3)]
[im 1/25]
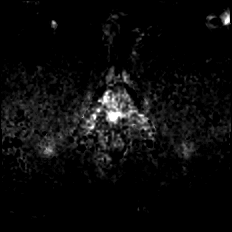

[Series 9: T2 · axial · 1.0mm · 1.04mm/px · 1 of 80 slices shown (3 of 3)]
[im 1/80]
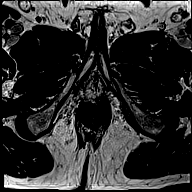

[Series 10: T1 · axial · 3.0mm · 1.15mm/px · 1 of 28 slices shown (1 of 48)]
[im 1/28]
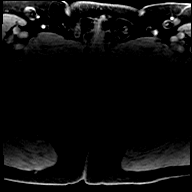

[Series 11: T1 · axial · 3.0mm · 1.15mm/px · 1 of 28 slices shown (2 of 48)]
[im 1/28]
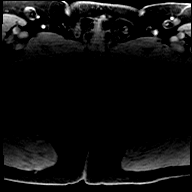

[Series 12: T1 · axial · 3.0mm · 1.15mm/px · 1 of 28 slices shown (3 of 48)]
[im 1/28]
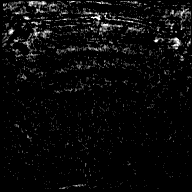

[Series 13: T1 · axial · 3.0mm · 1.15mm/px · 1 of 28 slices shown (4 of 48)]
[im 1/28]
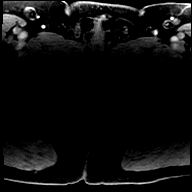

[Series 14: T1 · axial · 3.0mm · 1.15mm/px · 1 of 28 slices shown (5 of 48)]
[im 1/28]
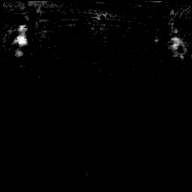

[Series 15: T1 · axial · 3.0mm · 1.15mm/px · 1 of 28 slices shown (6 of 48)]
[im 1/28]
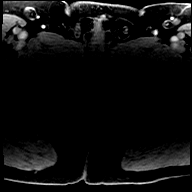

[Series 16: T1 · axial · 3.0mm · 1.15mm/px · 1 of 28 slices shown (7 of 48)]
[im 1/28]
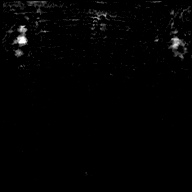

[Series 17: T1 · axial · 3.0mm · 1.15mm/px · 1 of 28 slices shown (8 of 48)]
[im 1/28]
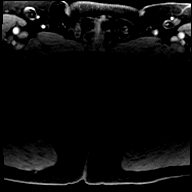

[Series 18: T1 · axial · 3.0mm · 1.15mm/px · 1 of 28 slices shown (9 of 48)]
[im 1/28]
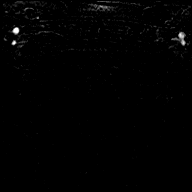

[Series 19: T1 · axial · 3.0mm · 1.15mm/px · 1 of 28 slices shown (10 of 48)]
[im 1/28]
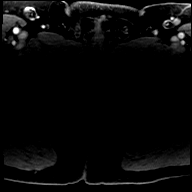

[Series 20: T1 · axial · 3.0mm · 1.15mm/px · 1 of 28 slices shown (11 of 48)]
[im 1/28]
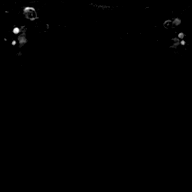

[Series 21: T1 · axial · 3.0mm · 1.15mm/px · 1 of 28 slices shown (12 of 48)]
[im 1/28]
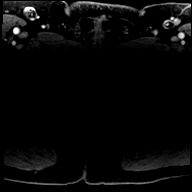

[Series 22: T1 · axial · 3.0mm · 1.15mm/px · 1 of 28 slices shown (13 of 48)]
[im 1/28]
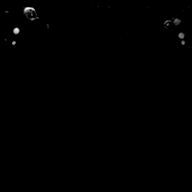

[Series 23: T1 · axial · 3.0mm · 1.15mm/px · 1 of 28 slices shown (14 of 48)]
[im 1/28]
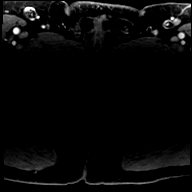

[Series 24: T1 · axial · 3.0mm · 1.15mm/px · 1 of 28 slices shown (15 of 48)]
[im 1/28]
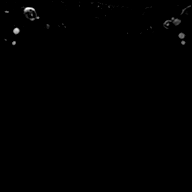

[Series 25: T1 · axial · 3.0mm · 1.15mm/px · 1 of 28 slices shown (16 of 48)]
[im 1/28]
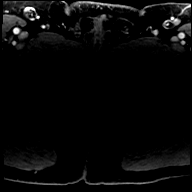

[Series 26: T1 · axial · 3.0mm · 1.15mm/px · 1 of 28 slices shown (17 of 48)]
[im 1/28]
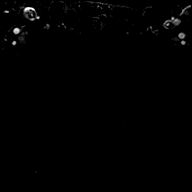

[Series 27: T1 · axial · 3.0mm · 1.15mm/px · 1 of 28 slices shown (18 of 48)]
[im 1/28]
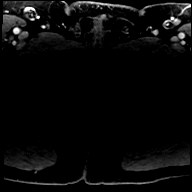

[Series 28: T1 · axial · 3.0mm · 1.15mm/px · 1 of 28 slices shown (19 of 48)]
[im 1/28]
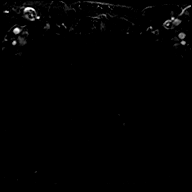

[Series 29: T1 · axial · 3.0mm · 1.15mm/px · 1 of 28 slices shown (20 of 48)]
[im 1/28]
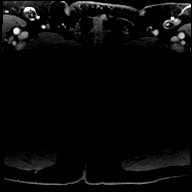

[Series 30: T1 · axial · 3.0mm · 1.15mm/px · 1 of 28 slices shown (21 of 48)]
[im 1/28]
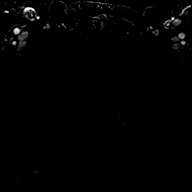

[Series 31: T1 · axial · 3.0mm · 1.15mm/px · 1 of 28 slices shown (22 of 48)]
[im 1/28]
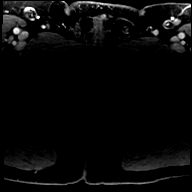

[Series 32: T1 · axial · 3.0mm · 1.15mm/px · 1 of 28 slices shown (23 of 48)]
[im 1/28]
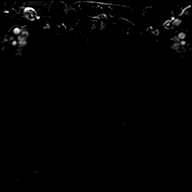

[Series 33: T1 · axial · 3.0mm · 1.15mm/px · 1 of 28 slices shown (24 of 48)]
[im 1/28]
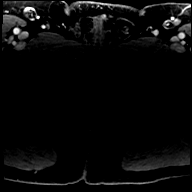

[Series 34: T1 · axial · 3.0mm · 1.15mm/px · 1 of 28 slices shown (25 of 48)]
[im 1/28]
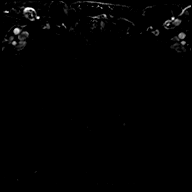

[Series 35: T1 · axial · 3.0mm · 1.15mm/px · 1 of 28 slices shown (26 of 48)]
[im 1/28]
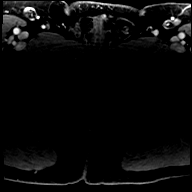

[Series 36: T1 · axial · 3.0mm · 1.15mm/px · 1 of 28 slices shown (27 of 48)]
[im 1/28]
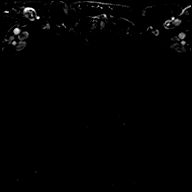

[Series 37: T1 · axial · 3.0mm · 1.15mm/px · 1 of 28 slices shown (28 of 48)]
[im 1/28]
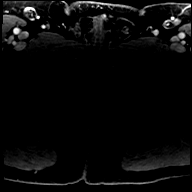

[Series 38: T1 · axial · 3.0mm · 1.15mm/px · 1 of 28 slices shown (29 of 48)]
[im 1/28]
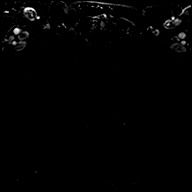

[Series 39: T1 · axial · 3.0mm · 1.15mm/px · 1 of 28 slices shown (30 of 48)]
[im 1/28]
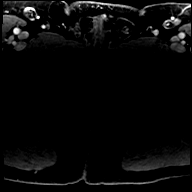

[Series 40: T1 · axial · 3.0mm · 1.15mm/px · 1 of 28 slices shown (31 of 48)]
[im 1/28]
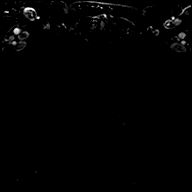

[Series 41: T1 · axial · 3.0mm · 1.15mm/px · 1 of 28 slices shown (32 of 48)]
[im 1/28]
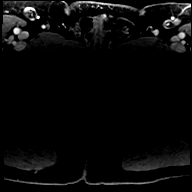

[Series 42: T1 · axial · 3.0mm · 1.15mm/px · 1 of 28 slices shown (33 of 48)]
[im 1/28]
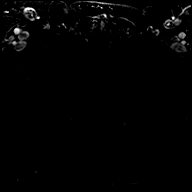

[Series 43: T1 · axial · 3.0mm · 1.15mm/px · 1 of 28 slices shown (34 of 48)]
[im 1/28]
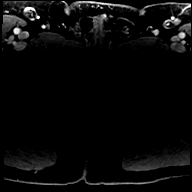

[Series 44: T1 · axial · 3.0mm · 1.15mm/px · 1 of 28 slices shown (35 of 48)]
[im 1/28]
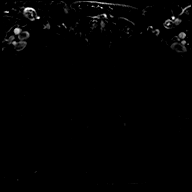

[Series 45: T1 · axial · 3.0mm · 1.15mm/px · 1 of 28 slices shown (36 of 48)]
[im 1/28]
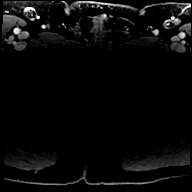

[Series 46: T1 · axial · 3.0mm · 1.15mm/px · 1 of 28 slices shown (37 of 48)]
[im 1/28]
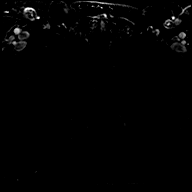

[Series 47: T1 · axial · 3.0mm · 1.15mm/px · 1 of 28 slices shown (38 of 48)]
[im 1/28]
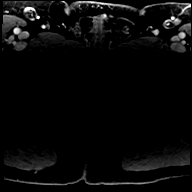

[Series 48: T1 · axial · 3.0mm · 1.15mm/px · 1 of 28 slices shown (39 of 48)]
[im 1/28]
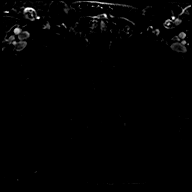

[Series 49: T1 · axial · 3.0mm · 1.15mm/px · 1 of 28 slices shown (40 of 48)]
[im 1/28]
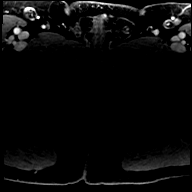

[Series 50: T1 · axial · 3.0mm · 1.15mm/px · 1 of 28 slices shown (41 of 48)]
[im 1/28]
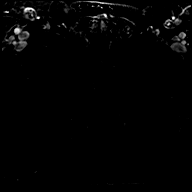

[Series 51: T1 · axial · 3.0mm · 1.15mm/px · 1 of 28 slices shown (42 of 48)]
[im 1/28]
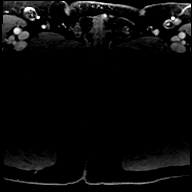

[Series 52: T1 · axial · 3.0mm · 1.15mm/px · 1 of 28 slices shown (43 of 48)]
[im 1/28]
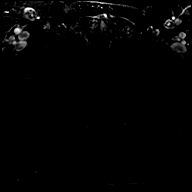

[Series 53: T1 · axial · 3.0mm · 1.15mm/px · 1 of 28 slices shown (44 of 48)]
[im 1/28]
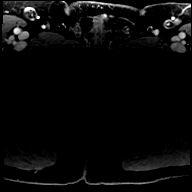

[Series 54: T1 · axial · 3.0mm · 1.15mm/px · 1 of 28 slices shown (45 of 48)]
[im 1/28]
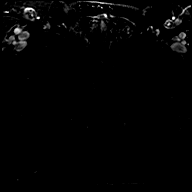

[Series 55: T1 · axial · 3.0mm · 1.15mm/px · 1 of 28 slices shown (46 of 48)]
[im 1/28]
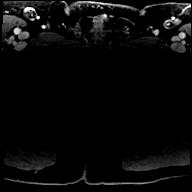

[Series 56: T1 · axial · 3.0mm · 1.15mm/px · 1 of 28 slices shown (47 of 48)]
[im 1/28]
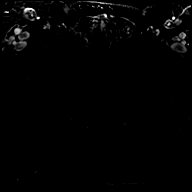

[Series 57: T1 · axial · 3.0mm · 1.15mm/px · 1 of 28 slices shown (48 of 48)]
[im 1/28]
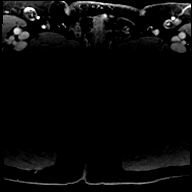

[56 of 56 positions shown; findings below may reference images not displayed]

FINDINGS: Prostate:

Overall morphologically unchanged prostate gland compared to the
prior exam, with hazy low T2 signal in the peripheral zone diffusely
but without a focal peripheral zone lesion or focal early
enhancement. Encapsulated nodularity along with some potential mild
cystic components in the transition zones especially anteriorly.
Much of the appearance is likely from prior thermal ablation
therapy, and overall is considered PI-RADS category 2. No lesion of
intermediate or higher suspicion for prostate cancer is identified.
I carefully compared the various regions in the peripheral and
central zone to the [DATE] exam, and I do not perceive any
substantial change or progression.

Volume: 3D volumetric analysis: Prostate volume 41.93 cc (5.2 by
by 4.5 cm).

Transcapsular spread:  Absent

Seminal vesicle involvement: Absent

Neurovascular bundle involvement: Absent

Pelvic adenopathy: Absent

Bone metastasis: Absent

Other findings: Sigmoid colon diverticulosis.
IMPRESSION: 1. Stable appearance the prostate gland compared to last year's
examination, with low T2 signal in the peripheral zone diffusely,
along with nodularity and some cystic appearance in the transition
zone, considered PI-RADS category 2 and likely residua from prior
thermotherapy. No focal lesion of intermediate or higher suspicion
for prostate cancer is identified.
2. Sigmoid colon diverticulosis.

## 2021-10-04 MED ORDER — GADOBUTROL 1 MMOL/ML IV SOLN
7.5000 mL | Freq: Once | INTRAVENOUS | Status: AC | PRN
  Administered 2021-10-04: 7.5 mL via INTRAVENOUS

## 2022-05-23 ENCOUNTER — Other Ambulatory Visit (HOSPITAL_COMMUNITY): Payer: Self-pay | Admitting: Urology

## 2022-05-23 ENCOUNTER — Other Ambulatory Visit: Payer: Self-pay | Admitting: Urology

## 2022-05-23 DIAGNOSIS — C61 Malignant neoplasm of prostate: Secondary | ICD-10-CM

## 2022-06-05 ENCOUNTER — Ambulatory Visit
Admission: RE | Admit: 2022-06-05 | Discharge: 2022-06-05 | Disposition: A | Discharge status: Home / Self Care | Payer: Medicare PPO | Source: Ambulatory Visit | Attending: Urology | Admitting: Urology

## 2022-06-05 ENCOUNTER — Encounter
Admission: RE | Admit: 2022-06-05 | Discharge: 2022-06-05 | Disposition: A | Discharge status: Home / Self Care | Payer: Medicare PPO | Source: Ambulatory Visit | Attending: Urology | Admitting: Urology

## 2022-06-05 DIAGNOSIS — C61 Malignant neoplasm of prostate: Secondary | ICD-10-CM | POA: Diagnosis not present

## 2022-06-05 LAB — POCT I-STAT CREATININE: Creatinine, Ser: 1.1 mg/dL (ref 0.61–1.24)

## 2022-06-05 MED ORDER — TECHNETIUM TC 99M MEDRONATE IV KIT
20.4800 | PACK | Freq: Once | INTRAVENOUS | Status: AC | PRN
  Administered 2022-06-05: 20.48 via INTRAVENOUS

## 2022-06-05 MED ORDER — IOHEXOL 300 MG/ML  SOLN
100.0000 mL | Freq: Once | INTRAMUSCULAR | Status: AC | PRN
  Administered 2022-06-05: 100 mL via INTRAVENOUS

## 2022-06-14 NOTE — Progress Notes (Signed)
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 Eliberto Ivory CT Abdomen and Pelvis without Contrast Clinical Data:  Prostate Cancer     Past/Anticipated interventions by urology, if any: NA  Past/Anticipated interventions by medical oncology, if any: NA  Weight changes, if any: {:18581}  IPPS: SHIM:  Bowel/Bladder complaints, if any: {:18581}   Nausea/Vomiting, if any: {:18581}  Pain issues, if any:  {:18581}  SAFETY ISSUES: Prior radiation? {:18581} Pacemaker/ICD? {:18581} Possible current pregnancy? Male Is the patient on methotrexate? {:18581}  Current Complaints / other details:

## 2022-06-18 ENCOUNTER — Other Ambulatory Visit: Payer: Self-pay

## 2022-06-18 ENCOUNTER — Ambulatory Visit
Admission: RE | Admit: 2022-06-18 | Discharge: 2022-06-18 | Disposition: A | Discharge status: Home / Self Care | Payer: Medicare PPO | Source: Ambulatory Visit | Attending: Radiation Oncology | Admitting: Radiation Oncology

## 2022-06-18 ENCOUNTER — Telehealth: Payer: Self-pay | Admitting: *Deleted

## 2022-06-18 VITALS — BP 145/87 | HR 82 | Temp 97.8°F | Resp 20 | Ht 68.0 in | Wt 192.6 lb

## 2022-06-18 VITALS — BP 145/87 | HR 82 | Temp 97.8°F | Wt 192.6 lb

## 2022-06-18 DIAGNOSIS — N200 Calculus of kidney: Secondary | ICD-10-CM | POA: Insufficient documentation

## 2022-06-18 DIAGNOSIS — I700 Atherosclerosis of aorta: Secondary | ICD-10-CM | POA: Insufficient documentation

## 2022-06-18 DIAGNOSIS — Z79899 Other long term (current) drug therapy: Secondary | ICD-10-CM | POA: Insufficient documentation

## 2022-06-18 DIAGNOSIS — K449 Diaphragmatic hernia without obstruction or gangrene: Secondary | ICD-10-CM | POA: Insufficient documentation

## 2022-06-18 DIAGNOSIS — C61 Malignant neoplasm of prostate: Secondary | ICD-10-CM | POA: Insufficient documentation

## 2022-06-18 DIAGNOSIS — I10 Essential (primary) hypertension: Secondary | ICD-10-CM | POA: Insufficient documentation

## 2022-06-18 DIAGNOSIS — K5730 Diverticulosis of large intestine without perforation or abscess without bleeding: Secondary | ICD-10-CM | POA: Diagnosis not present

## 2022-06-18 DIAGNOSIS — D569 Thalassemia, unspecified: Secondary | ICD-10-CM | POA: Insufficient documentation

## 2022-06-18 DIAGNOSIS — Z8052 Family history of malignant neoplasm of bladder: Secondary | ICD-10-CM | POA: Diagnosis not present

## 2022-06-18 DIAGNOSIS — Z8042 Family history of malignant neoplasm of prostate: Secondary | ICD-10-CM | POA: Insufficient documentation

## 2022-06-18 NOTE — Progress Notes (Signed)
Introduced myself to the patient as the prostate nurse navigator.  No barriers to care identified at this time.  He is here to discuss his radiation treatment options.  I gave him my business card and asked him to call me with questions or concerns.  Verbalized understanding.  ?

## 2022-06-18 NOTE — Progress Notes (Signed)
Radiation Oncology         [phone] ________________________________  Initial Outpatient Consultation  Name: Walter Harrison MRN: [id number]  Date: 06/18/2022  DOB: 1951/10/02  CC:[facility]  Yves Dill, Otelia Limes, MD   REFERRING PHYSICIAN: Royston Cowper, MD  DIAGNOSIS: 71 y.o. 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    ICD-10-CM   1. Malignant neoplasm of prostate ([place])  C61       HISTORY OF PRESENT ILLNESS: Walter Harrison is a 71 y.o. male with a diagnosis of prostate cancer. He was noted to have an elevated PSA of 5.6 in 09/2021 by his urologist, Dr. Yves Dill.  Accordingly, he underwent prostate MRI in 10/2021, which did not show any lesions.  A repeat PSA on 04/17/22 showed further elevation to 8.4.  Therefore, the patient proceeded to transrectal ultrasound with 12 biopsies of the prostate on 05/14/22.  The prostate volume measured 47.5 cc.  Out of 12 core biopsies, 7 were positive.  The maximum Gleason score was 4+4, and this was seen in the left mid, left lateral mid, right mid, and left lateral apex. Additionally, Gleason 4+3 was seen in the right apex, left apex, and left base.  Dr. Yves Dill obtained a Genomic Prostate Score on the biopsy sample and his showed a score of 46, which is considered higher risk of disease progression and indicated a 21% chance of developing metastasis within 10 years.   He underwent disease staging with CT A/P and bone scan on 06/05/22, both of which showed no evidence of metastatic disease.  The patient reviewed the biopsy results with his urologist and he has kindly been referred today for discussion of potential radiation treatment options.   PREVIOUS RADIATION THERAPY: No  PAST MEDICAL HISTORY:  Past Medical History:  Diagnosis Date   BPH (benign prostatic hyperplasia)    Hay fever    Hypertension    Pneumonia    Thalassemia 06/2012      PAST SURGICAL HISTORY: Past  Surgical History:  Procedure Laterality Date   COLONOSCOPY  2009   Dr. Posey Pronto: diverticulosis but h/o colon polyps on prior   COLONOSCOPY N/A 04/07/2014   Procedure: COLONOSCOPY;  Surgeon: Daneil Dolin, MD;  [facility]: [facility];  Service: Endoscopy;  Laterality: N/A;  1:45   COLONOSCOPY N/A 07/21/2019   Procedure: COLONOSCOPY;  Surgeon: Daneil Dolin, MD;  Location: [facility];  Service: Endoscopy;  Laterality: N/A;  8:30   ESOPHAGOGASTRODUODENOSCOPY     multiple times by Dr. Posey Pronto, 7-8 times. yearly for the hiatal hernia. no barrett's esophagus per patient.  Dr. Posey Pronto   ESOPHAGOGASTRODUODENOSCOPY  03/2012   Dr. Posey Pronto: Bartholomew, 7cm paraesophageal hernia, gastritis   HERNIA REPAIR     hiatal hernia repair, [facility]   TONSILLECTOMY     TRANSURETHRAL RESECTION OF PROSTATE      FAMILY HISTORY:  Family History  Problem Relation Age of Onset   Cancer - Prostate Father    Stroke Sister    Kidney disease Sister    Prostate cancer Brother        has had bladder cancer, now fighting four battle with cancer   Colon cancer Neg Hx     SOCIAL HISTORY:  Social History   Socioeconomic History   Marital status: Married    Spouse name: Not on file   Number of children: 3   Years of education: Not on file  Highest education level: Not on file  Occupational History    Employer: [employer]  Tobacco Use   Smoking status: Never   Smokeless tobacco: Former  Substance and Sexual Activity   Alcohol use: No   Drug use: No   Sexual activity: Yes  Other Topics Concern   Not on file  Social History Narrative   Not on file   Social Determinants of Health   Financial Resource Strain: Not on file  Food Insecurity: Not on file  Transportation Needs: Not on file  Physical Activity: Not on file  Stress: Not on file  Social Connections: Not on file  Intimate Partner Violence: Not on file    ALLERGIES: Sulfa antibiotics  MEDICATIONS:  Current Outpatient Medications   Medication Sig Dispense Refill   allopurinol (ZYLOPRIM) 300 MG tablet Take 300 mg by mouth daily.     amoxicillin-clavulanate (AUGMENTIN) 500-125 MG tablet SMARTSIG:1 Tablet(s) By Mouth Every 12 Hours     aspirin 81 MG chewable tablet Chew 81 mg by mouth daily after breakfast.     chlorthalidone (HYGROTON) 25 MG tablet Take 12.5 mg by mouth daily after breakfast.      Multiple Vitamin (MULTIVITAMIN WITH MINERALS) TABS tablet Take 1 tablet by mouth daily after breakfast. Centrum Silver     tamsulosin (FLOMAX) 0.4 MG CAPS capsule SMARTSIG:1 Capsule(s) By Mouth Every Evening     No current facility-administered medications for this encounter.    REVIEW OF SYSTEMS:  On review of systems, the patient reports that he is doing well overall. He denies any chest pain, shortness of breath, cough, fevers, chills, night sweats, unintended weight changes. He denies any bowel disturbances, and denies abdominal pain, nausea or vomiting. He denies any new musculoskeletal or joint aches or pains. His IPSS was 13, indicating moderate urinary symptoms. He reports he developed a bladder infection after his biopsy and is currently on antibiotics. His SHIM was 24, indicating he does not have erectile dysfunction. A complete review of systems is obtained and is otherwise negative.    PHYSICAL EXAM:  Wt Readings from Last 3 Encounters:  06/18/22 192 lb 9.6 oz (87.4 kg)  06/18/22 192 lb 9.6 oz (87.4 kg)  07/21/19 187 lb (84.8 kg)   Temp Readings from Last 3 Encounters:  06/18/22 97.8 F (36.6 C) (Oral)  06/18/22 97.8 F (36.6 C) (Oral)  07/21/19 97.8 F (36.6 C) (Oral)   BP Readings from Last 3 Encounters:  06/18/22 (!) 145/87  06/18/22 (!) 145/87  07/21/19 101/62   Pulse Readings from Last 3 Encounters:  06/18/22 82  06/18/22 82  07/21/19 70   Pain Assessment Pain Score: 0-No pain/10  In general this is a well appearing African-American male in no acute distress. He's alert and oriented x4 and  appropriate throughout the examination. Cardiopulmonary assessment is negative for acute distress, and he exhibits normal effort.     KPS = 100  100 - Normal; no complaints; no evidence of disease. 90   - Able to carry on normal activity; minor signs or symptoms of disease. 80   - Normal activity with effort; some signs or symptoms of disease. 71   - Cares for self; unable to carry on normal activity or to do active work. 60   - Requires occasional assistance, but is able to care for most of his personal needs. 50   - Requires considerable assistance and frequent medical care. 44   - Disabled; requires special care and assistance. 30   -  Severely disabled; hospital admission is indicated although death not imminent. 63   - Very sick; hospital admission necessary; active supportive treatment necessary. 10   - Moribund; fatal processes progressing rapidly. 0     - Dead  Karnofsky DA, Abelmann [place], Craver LS and Burchenal [facility] [phone]) The use of the nitrogen mustards in the palliative treatment of carcinoma: with particular reference to bronchogenic carcinoma Cancer 1 634-56  LABORATORY DATA:  Lab Results  Component Value Date   WBC 9.9 03/28/2015   HGB 15.9 03/28/2015   HCT 46.0 03/28/2015   MCV 84.9 03/28/2015   PLT 168 03/28/2015   Lab Results  Component Value Date   NA 137 03/28/2015   K 3.4 (L) 03/28/2015   CL 102 03/28/2015   CO2 26 03/28/2015   No results found for: "ALT", "AST", "GGT", "ALKPHOS", "BILITOT"   RADIOGRAPHY: NM Bone Scan Whole Body  Result Date: 06/05/2022 CLINICAL DATA:  History of prostate cancer. No pain. No recent falls or trauma. No history of surgery to bone. History of left ankle fracture when patient was 71 years old. Broken tooth on top right side of mouth. EXAM: NUCLEAR MEDICINE WHOLE BODY BONE SCAN TECHNIQUE: Whole body anterior and posterior images were obtained approximately 3 hours after intravenous injection of radiopharmaceutical.  RADIOPHARMACEUTICALS:  20.48 mCi Technetium-56mMDP IV COMPARISON:  CT abdomen and pelvis 06/05/2022, CT chest abdomen and pelvis 01/26/2010. FINDINGS: Degenerative type activity demonstrated in the shoulders, wrists, knees, ankles, sacroiliac joints, and spine. Otherwise normal homogeneous tracer uptake is demonstrated. Symmetrical activity in the kidneys. IMPRESSION: 1. Degenerative type activity in multiple peripheral joints and in the spine. 2. No scintigraphic evidence of bone metastasis. Electronically Signed   By: WLucienne CapersM.D.   On: 06/05/2022 18:42   CT ABDOMEN PELVIS W WO CONTRAST  Result Date: 06/05/2022 CLINICAL DATA:  Prostate cancer follow-up.  * Tracking Code: BO * EXAM: CT ABDOMEN AND PELVIS WITHOUT AND WITH CONTRAST TECHNIQUE: Multidetector CT imaging of the abdomen and pelvis was performed following the standard protocol before and following the bolus administration of intravenous contrast. RADIATION DOSE REDUCTION: This exam was performed according to the departmental dose-optimization program which includes automated exposure control, adjustment of the mA and/or kV according to patient size and/or use of iterative reconstruction technique. CONTRAST:  1074mOMNIPAQUE IOHEXOL 300 MG/ML  SOLN COMPARISON:  CT January 26, 2010 and MRI October 04, 2021 FINDINGS: Lower chest: No acute abnormality.  Moderate size hiatal hernia. Hepatobiliary: No suspicious hepatic lesion. Gallbladder is unremarkable. No biliary ductal dilation. Pancreas: No pancreatic ductal dilation or evidence of acute inflammation. Spleen: No splenomegaly. Adrenals/Urinary Tract: Bilateral adrenal glands are within normal limits. No hydronephrosis. Nonobstructive left lower pole renal calculus measures 6 mm. No suspicious renal mass. Effacement of the urinary bladder by an enlarged prostate gland Stomach/Bowel: No radiopaque enteric contrast material was administered. Moderate-sized hiatal hernia otherwise the stomach is  unremarkable for degree of distension. No pathologic dilation of small or large bowel. No evidence of acute bowel inflammation. Pancolonic diverticulosis without findings of acute diverticulitis. Vascular/Lymphatic: Aortic atherosclerosis. No pathologically enlarged abdominal or pelvic lymph nodes. Reproductive: Heterogeneous enlarged prostate gland poorly evaluated on CT but similar in appearance to MRI October 04, 2021 and likely reflecting sequela of thermotherapy. Other: No significant abdominopelvic free fluid. Musculoskeletal: No suspicious lytic or blastic lesion of bone. Multilevel degenerative changes spine. Degenerative changes bilateral SI joints. IMPRESSION: 1. Heterogeneous nodular enlargement of the prostate gland poorly evaluated on CT but similar  appearance to MRI dated October 04, 2021 and likely reflecting sequela of thermotherapy. 2. No evidence of metastatic disease in the abdomen or pelvis. 3. Moderate-sized hiatal hernia. 4. Nonobstructive 6 mm left lower pole renal stone. 5. Pancolonic diverticulosis without findings of acute diverticulitis. 6.  Aortic Atherosclerosis (ICD10-I70.0). Electronically Signed   By: Dahlia Bailiff M.D.   On: 06/05/2022 12:37      IMPRESSION/PLAN: 1. 71 y.o. gentleman with Stage T2c adenocarcinoma of the prostate with Gleason Score of 4+4, and PSA of 8.4. We discussed the patient's workup and outlined the nature of prostate cancer in this setting. The patient's T stage, Gleason's score, and PSA put him into the high risk group. Accordingly, he is eligible for a variety of potential treatment options including prostatectomy or LT-ADT in combination with either 8 weeks of external radiation or 5 weeks of external radiation with an upfront brachytherapy boost. We discussed the available radiation techniques, and focused on the details and logistics of delivery. We discussed and outlined the risks, benefits, short and [place]-term effects associated with radiotherapy  and compared and contrasted these with prostatectomy. We also detailed the role of ADT in the treatment of high risk prostate cancer and outlined the associated side effects that could be expected with this therapy. He appears to have a good understanding of his disease and our treatment recommendations which are of curative intent.  He was encouraged to ask questions that were answered to his stated satisfaction.  At the conclusion of our conversation, the patient is interested in moving forward with 8 weeks of external beam therapy concurrent with ADT. He has not yet started ADT so we will share our discussion with Dr. Yves Dill and make arrangements for a follow-up visit to start ADT, first available. We will also coordinate for fiducial marker placement in October 2023, prior to simulation, in anticipation of beginning IMRT approximately 2 months after starting ADT.  We enjoyed meeting him today and look forward to continuing to participate in his care.  We personally spent 70 minutes in this encounter including chart review, reviewing radiological studies, meeting face-to-face with the patient, entering orders and completing documentation.    Nicholos Johns, PA-C    Tyler Pita, MD  [facility] Direct Dial: [phone]  Fax: [phone] [email or web address]  Skype  [email or web address]   This document serves as a record of services personally performed by Tyler Pita, MD and Freeman Caldron, PA-C. It was created on their behalf by Wilburn Mylar, a trained medical scribe. The creation of this record is based on the scribe's personal observations and the provider's statements to them. This document has been checked and approved by the attending provider.

## 2022-06-18 NOTE — Telephone Encounter (Signed)
CALLED PATIENT TO INFORM OF ADT APPT. FOR 06-26-22- ARRIVAL TIME- 11 AM, @  DR. [facility] OFFICE IN [place] SPOKE WITH PATIENT AND HE IS AWARE OF THIS APPT.

## 2022-06-27 ENCOUNTER — Emergency Department (HOSPITAL_COMMUNITY): Payer: Medicare PPO

## 2022-06-27 ENCOUNTER — Emergency Department (HOSPITAL_COMMUNITY)
Admission: EM | Admit: 2022-06-27 | Discharge: 2022-06-27 | Disposition: A | Discharge status: Home / Self Care | Payer: Medicare PPO | Attending: Emergency Medicine | Admitting: Emergency Medicine

## 2022-06-27 ENCOUNTER — Other Ambulatory Visit: Payer: Self-pay

## 2022-06-27 ENCOUNTER — Encounter (HOSPITAL_COMMUNITY): Payer: Self-pay | Admitting: Emergency Medicine

## 2022-06-27 DIAGNOSIS — W11XXXA Fall on and from ladder, initial encounter: Secondary | ICD-10-CM | POA: Insufficient documentation

## 2022-06-27 DIAGNOSIS — W19XXXA Unspecified fall, initial encounter: Secondary | ICD-10-CM

## 2022-06-27 DIAGNOSIS — E876 Hypokalemia: Secondary | ICD-10-CM | POA: Diagnosis not present

## 2022-06-27 DIAGNOSIS — Z7982 Long term (current) use of aspirin: Secondary | ICD-10-CM | POA: Insufficient documentation

## 2022-06-27 DIAGNOSIS — S60111A Contusion of right thumb with damage to nail, initial encounter: Secondary | ICD-10-CM | POA: Diagnosis not present

## 2022-06-27 DIAGNOSIS — S0101XA Laceration without foreign body of scalp, initial encounter: Secondary | ICD-10-CM | POA: Insufficient documentation

## 2022-06-27 DIAGNOSIS — Z23 Encounter for immunization: Secondary | ICD-10-CM | POA: Insufficient documentation

## 2022-06-27 DIAGNOSIS — S61111A Laceration without foreign body of right thumb with damage to nail, initial encounter: Secondary | ICD-10-CM | POA: Insufficient documentation

## 2022-06-27 DIAGNOSIS — S0990XA Unspecified injury of head, initial encounter: Secondary | ICD-10-CM | POA: Diagnosis present

## 2022-06-27 HISTORY — DX: Malignant (primary) neoplasm, unspecified: C80.1

## 2022-06-27 LAB — CBC
HCT: 45.3 % (ref 39.0–52.0)
Hemoglobin: 15 g/dL (ref 13.0–17.0)
MCH: 29.6 pg (ref 26.0–34.0)
MCHC: 33.1 g/dL (ref 30.0–36.0)
MCV: 89.3 fL (ref 80.0–100.0)
Platelets: 199 10*3/uL (ref 150–400)
RBC: 5.07 MIL/uL (ref 4.22–5.81)
RDW: 14.5 % (ref 11.5–15.5)
WBC: 7.6 10*3/uL (ref 4.0–10.5)
nRBC: 0 % (ref 0.0–0.2)

## 2022-06-27 LAB — BASIC METABOLIC PANEL
Anion gap: 10 (ref 5–15)
BUN: 14 mg/dL (ref 8–23)
CO2: 23 mmol/L (ref 22–32)
Calcium: 9.2 mg/dL (ref 8.9–10.3)
Chloride: 105 mmol/L (ref 98–111)
Creatinine, Ser: 0.97 mg/dL (ref 0.61–1.24)
GFR, Estimated: >60 mL/min (ref >60)
Glucose, Bld: 100 mg/dL — ABNORMAL HIGH (ref 70–99)
Potassium: 3.3 mmol/L — ABNORMAL LOW (ref 3.5–5.1)
Sodium: 138 mmol/L (ref 135–145)

## 2022-06-27 MED ORDER — POTASSIUM CHLORIDE 20 MEQ PO PACK
20.0000 meq | PACK | Freq: Once | ORAL | Status: AC
  Administered 2022-06-27: 20 meq via ORAL
  Filled 2022-06-27: qty 1

## 2022-06-27 MED ORDER — LIDOCAINE HCL (PF) 1 % IJ SOLN
30.0000 mL | Freq: Once | INTRAMUSCULAR | Status: AC
  Administered 2022-06-27: 30 mL
  Filled 2022-06-27: qty 30

## 2022-06-27 MED ORDER — TETANUS-DIPHTH-ACELL PERTUSSIS 5-2.5-18.5 LF-MCG/0.5 IM SUSY
0.5000 mL | PREFILLED_SYRINGE | Freq: Once | INTRAMUSCULAR | Status: AC
  Administered 2022-06-27: 0.5 mL via INTRAMUSCULAR
  Filled 2022-06-27: qty 0.5

## 2022-06-27 MED ORDER — CEPHALEXIN 500 MG PO CAPS: 500.0000 mg | ORAL_CAPSULE | Freq: Four times a day (QID) | ORAL | 0 refills | Status: DC

## 2022-06-27 NOTE — ED Provider Triage Note (Signed)
Emergency Medicine Provider Triage Evaluation Note  Walter Harrison , a 71 y.o. male  was evaluated in triage.  Pt complains of fall from 12-14 off ladder.  Patient reports that he slid down the side of the house trying to catch himself.  He is missing a tooth in the right upper face, but reports that his teeth otherwise for together.  Wife produces the tooth, but the root is broken off.  The incident happened around 1030 this morning.  He is a 5 to 6 cm laceration on his right forehead that is actively bleeding.  He does not take any blood thinners at this time.  He denies any loss of consciousness, neck pain, shoulder, chest, hip, low back pain.  He denies any numbness, tingling.  He additionally has a right thumb injury with laceration/avulsion of the ventral surface of skin away from the nail, no noted nail injury on initial evaluation.  Patient rates pain 5/10 at this time..  Review of Systems  Positive: Fall head injury, hand injury, face injury Negative: Loss of consciousness, chest pain, hip pain, inability to ambulate, numbness, tingling, paresthesias  Physical Exam  BP (!) 146/83   Pulse 81   Temp 98.1 F (36.7 C)   Resp 18   Ht '5\' 8"'$  (1.727 m)   Wt 83.9 kg   SpO2 99%   BMI 28.13 kg/m  Gen:   Awake, no distress   Resp:  Normal effort  MSK:   Moves extremities without difficulty  Other:  Right frontal forehead laceration with active bleeding, right thumb laceration, missing tooth and right upper jaw, no palpable step-off or deformity of jaw  Medical Decision Making  Medically screening exam initiated at 1:30 PM.  Appropriate orders placed.  Eliu Batch was informed that the remainder of the evaluation will be completed by another provider, this initial triage assessment does not replace that evaluation, and the importance of remaining in the ED until their evaluation is complete.  Workup initiated   Anselmo Pickler, [place] 06/27/22 1333

## 2022-06-27 NOTE — ED Triage Notes (Signed)
Pt slide appx 12-14 feet down ladder from this am. Laceration to forehead, upper lip, and right thumb. Also knocked out a tooth. Denies loc.

## 2022-06-27 NOTE — Progress Notes (Signed)
Pt started ADT, Orgovyx, on 8/23 under the care of Dr. Yves Dill.   RN left voicemail for call back to discuss/review next steps for treatment.

## 2022-06-27 NOTE — ED Notes (Signed)
Wounds irrigated

## 2022-06-27 NOTE — ED Provider Notes (Signed)
[facility] EMERGENCY DEPARTMENT Provider Note   CSN: [id number] Arrival date & time: 06/27/22  1131     History  Chief Complaint  Patient presents with   Walter Harrison is a 71 y.o. male Pt complains of fall from 12-53f off ladder.  Patient reports that he slid [facility] the side of the community [facility] trying to catch himself.  He is missing a tooth in the right upper face, but reports that his teeth otherwise for together.  Wife produces the tooth, but the root is broken off.  The incident happened around 1030 this morning.  He is a 5 to 6 cm laceration on his right forehead that is actively bleeding.  He does not take any blood thinners at this time.  He denies any loss of consciousness, neck pain, shoulder, chest, hip, low back pain.  He denies any numbness, tingling.  He additionally has a right thumb injury with laceration/avulsion of the ventral surface of skin away from the nail, no noted nail injury on initial evaluation.  Patient rates pain 5/10 at this time.   Fall       Home Medications Prior to Admission medications   Medication Sig Start Date End Date Taking? Authorizing Provider  cephALEXin (KEFLEX) 500 MG capsule Take 1 capsule (500 mg total) by mouth 4 (four) times daily. 06/27/22  Yes Rajvi Armentor H, PA-C  allopurinol (ZYLOPRIM) 300 MG tablet Take 300 mg by mouth daily. 05/18/22   [provider]  amoxicillin-clavulanate (AUGMENTIN) 500-125 MG tablet SMARTSIG:1 Tablet(s) By Mouth Every 12 Hours 06/12/22   [provider]  aspirin 81 MG chewable tablet Chew 81 mg by mouth daily after breakfast.    [provider]  chlorthalidone (HYGROTON) 25 MG tablet Take 12.5 mg by mouth daily after breakfast.     [provider]  Multiple Vitamin (MULTIVITAMIN WITH MINERALS) TABS tablet Take 1 tablet by mouth daily after breakfast. Centrum Silver    [provider]  tamsulosin (FLOMAX) 0.4 MG CAPS capsule SMARTSIG:1 Capsule(s) By  Mouth Every Evening 06/10/22   [provider]      Allergies    Sulfa antibiotics    Review of Systems   Review of Systems  Musculoskeletal:  Positive for arthralgias and joint swelling.  Skin:  Positive for wound.  All other systems reviewed and are negative.   Physical Exam Updated Vital Signs BP (!) 146/103   Pulse 81   Temp 98 F (36.7 C) (Oral)   Resp 19   Ht '5\' 8"'$  (1.727 m)   Wt 83.9 kg   SpO2 95%   BMI 28.13 kg/m  Physical Exam Vitals and nursing note reviewed.  Constitutional:      General: He is not in acute distress.    Appearance: Normal appearance.  HENT:     Head: Normocephalic and atraumatic.     Mouth/Throat:     Comments: Patient with some small abrasions without open laceration of both the upper and lower lip on the right.  He is missing tooth 5.  They have the broken tooth with them and it is not attached to the root.  There is no active bleeding at time my evaluation.  There are some soft tissue swelling of the gums.  No other injuries noted in the oropharynx.  Patient reports that teeth fit together well.  No tongue lacerations. Eyes:     General:        Right eye: No discharge.  Left eye: No discharge.  Cardiovascular:     Rate and Rhythm: Normal rate and regular rhythm.     Heart sounds: No murmur heard.    No friction rub. No gallop.  Pulmonary:     Effort: Pulmonary effort is normal.     Breath sounds: Normal breath sounds.  Abdominal:     General: Bowel sounds are normal.     Palpations: Abdomen is soft.  Musculoskeletal:     Comments: No tenderness to palpation, step-off, deformity of bilateral shoulders, hips, midline spine, arms, legs.  Very tender on right thumb, questionable step-off.  Patient is ambulatory without difficulty.  Intact strength out of 5 bilateral upper and lower extremities.  Patient has intact flexion extension of the right thumb at all feeling segments without difficulty.  Some soft tissue swelling at the  distal thumb.  Skin:    General: Skin is warm and dry.     Capillary Refill: Capillary refill takes less than 2 seconds.     Comments: Large laceration noted frontal forehead, approximately 5cm in length, at least 66m penetration, galea is violated, approximately 1cm small lower laceration noted, repaired with vicryl as discussed above.  Right thumb with obvious subungual hematoma, which was relieved with trephination.  He has a flap of devascularized tissue at the tip of the right thumb, see pictures in media tab for further information, there is no obvious subungual lacerations we will not remove.    Neurological:     Mental Status: He is alert and oriented to person, place, and time.  Psychiatric:        Mood and Affect: Mood normal.        Behavior: Behavior normal.     ED Results / Procedures / Treatments   Labs (all labs ordered are listed, but only abnormal results are displayed) Labs Reviewed  BASIC METABOLIC PANEL - Abnormal; Notable for the following components:      Result Value   Potassium 3.3 (*)    Glucose, Bld 100 (*)    All other components within normal limits  CBC    EKG None  Radiology CT Lumbar Spine Wo Contrast  Result Date: 06/27/2022 CLINICAL DATA:  Low back pain, fall from a ladder. History of prostate cancer. EXAM: CT LUMBAR SPINE WITHOUT CONTRAST TECHNIQUE: Multidetector CT imaging of the lumbar spine was performed without intravenous contrast administration. Multiplanar CT image reconstructions were also generated. RADIATION DOSE REDUCTION: This exam was performed according to the departmental dose-optimization program which includes automated exposure control, adjustment of the mA and/or kV according to patient size and/or use of iterative reconstruction technique. COMPARISON:  CT abdomen 06/05/2022 FINDINGS: Segmentation: The lowest lumbar type non-rib-bearing vertebra is labeled as L5. Alignment: No vertebral subluxation is observed. Vertebrae: No  lumbar spine fracture. Bridging spurring of the SI joints anteriorly. Paraspinal and other soft tissues: Atherosclerosis is present, including aortoiliac atherosclerotic disease. Nonobstructive left nephrolithiasis. Descending and sigmoid colon diverticulosis. Disc levels: L1-2: Unremarkable. L2-3: Mild bilateral foraminal stenosis due to facet arthropathy and disc bulge. L3-4: Mild left and borderline right foraminal stenosis due to facet arthropathy and disc bulge. L4-5: No impingement.  Mild disc bulge. L5-S1: Moderate right foraminal stenosis due to intervertebral and facet spurring along with disc bulge. IMPRESSION: 1. No acute lumbar spine findings. 2. Lumbar spondylosis and degenerative disc disease cause moderate impingement at L5-S1 and mild impingement at L2-3 and L3-4. 3.  Aortic Atherosclerosis (ICD10-I70.0). 4. Nonobstructive left nephrolithiasis. 5. Colonic diverticulosis. Electronically Signed  By: Van Clines M.D.   On: 06/27/2022 15:47   CT Head Wo Contrast  Result Date: 06/27/2022 CLINICAL DATA:  Fall from a ladder with laceration to the mid forehead. EXAM: CT HEAD WITHOUT CONTRAST CT MAXILLOFACIAL WITHOUT CONTRAST CT CERVICAL SPINE WITHOUT CONTRAST TECHNIQUE: Multidetector CT imaging of the head, cervical spine, and maxillofacial structures were performed using the standard protocol without intravenous contrast. Multiplanar CT image reconstructions of the cervical spine and maxillofacial structures were also generated. RADIATION DOSE REDUCTION: This exam was performed according to the departmental dose-optimization program which includes automated exposure control, adjustment of the mA and/or kV according to patient size and/or use of iterative reconstruction technique. COMPARISON:  01/26/2010 FINDINGS: CT HEAD FINDINGS Brain: Remote right inferior cerebellar infarct. Chronic fluid density prominence below the left into or EM, suspected encephalomalacia in this vicinity versus  arachnoid cyst, as shown in 2011. Otherwise, the brainstem, cerebellum, cerebral peduncles, thalamus, basal ganglia, basilar cisterns, and ventricular system appear within normal limits. No intracranial hemorrhage, mass lesion, or acute CVA. Vascular: There is atherosclerotic calcification of the cavernous carotid arteries bilaterally. Skull: Unremarkable Other: Substantial suspected anterior laceration along the forehead. CT MAXILLOFACIAL FINDINGS Osseous: No acute facial fracture is identified. 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 Soft tissues: Midline forehead scalp laceration. CT CERVICAL SPINE FINDINGS Alignment: 2 mm of degenerative retrolisthesis at C3-4, unchanged from 01/27/2020. Skull base and vertebrae: Klippel-Feil deformity, with C1 fused with C2, and with congenital interbody and posterior element fusion at C7-T1, not changed from the prior exam. Progressive loss of intervertebral disc space and endplate irregularity at the C2-3 level. 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 No cervical spine fracture or acute subluxation identified. Soft tissues and spinal canal: Bilateral common carotid atherosclerotic vascular disease. Disc levels: Mild osseous foraminal stenosis on the left at C2-3 and C3-4 due to uncinate and facet spurring. There is right foraminal stenosis at C5-6 due to uncinate spurring. Upper chest: Unremarkable Other: No supplemental non-categorized findings. IMPRESSION: 1. Laceration along the midline scalp in the forehead. 2. No acute intracranial findings, acute cervical spine findings, or acute facial fracture. 3. 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 4. Remote right inferior cerebellar infarct. Fluid density along the left upper cerebellum potentially from cystic  encephalomalacia or arachnoid cyst, neither changed from 2011. 5. 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 6. Cervical spondylosis cause foraminal impingement C2-3, C3-4, and C5-6. Electronically Signed   By: Van Clines M.D.   On: 06/27/2022 15:35   CT Cervical Spine Wo Contrast  Result Date: 06/27/2022 CLINICAL DATA:  Fall from a ladder with laceration to the mid forehead. EXAM: CT HEAD WITHOUT CONTRAST CT MAXILLOFACIAL WITHOUT CONTRAST CT CERVICAL SPINE WITHOUT CONTRAST TECHNIQUE: Multidetector CT imaging of the head, cervical spine, and maxillofacial structures were performed using the standard protocol without intravenous contrast. Multiplanar CT image reconstructions of the cervical spine and maxillofacial structures were also generated. RADIATION DOSE REDUCTION: This exam was performed according to the departmental dose-optimization program which includes automated exposure control, adjustment of the mA and/or kV according to patient size and/or use of iterative reconstruction technique. COMPARISON:  01/26/2010 FINDINGS: CT HEAD FINDINGS Brain: Remote right inferior cerebellar infarct. Chronic fluid density prominence below the left into or EM, suspected encephalomalacia in this vicinity versus arachnoid cyst, as shown in  2011. Otherwise, the brainstem, cerebellum, cerebral peduncles, thalamus, basal ganglia, basilar cisterns, and ventricular system appear within normal limits. No intracranial hemorrhage, mass lesion, or acute CVA. Vascular: There is atherosclerotic calcification of the cavernous carotid arteries bilaterally. Skull: Unremarkable Other: Substantial suspected anterior laceration along the forehead. CT MAXILLOFACIAL FINDINGS Osseous: No acute facial fracture is identified. 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 Soft tissues: Midline forehead scalp laceration. CT CERVICAL SPINE FINDINGS Alignment: 2 mm of degenerative retrolisthesis  at C3-4, unchanged from 01/27/2020. Skull base and vertebrae: Klippel-Feil deformity, with C1 fused with C2, and with congenital interbody and posterior element fusion at C7-T1, not changed from the prior exam. Progressive loss of intervertebral disc space and endplate irregularity at the C2-3 level. 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 No cervical spine fracture or acute subluxation identified. Soft tissues and spinal canal: Bilateral common carotid atherosclerotic vascular disease. Disc levels: Mild osseous foraminal stenosis on the left at C2-3 and C3-4 due to uncinate and facet spurring. There is right foraminal stenosis at C5-6 due to uncinate spurring. Upper chest: Unremarkable Other: No supplemental non-categorized findings. IMPRESSION: 1. Laceration along the midline scalp in the forehead. 2. No acute intracranial findings, acute cervical spine findings, or acute facial fracture. 3. 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 4. Remote right inferior cerebellar infarct. Fluid density along the left upper cerebellum potentially from cystic encephalomalacia or arachnoid cyst, neither changed from 2011. 5. 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 6. Cervical spondylosis cause foraminal impingement C2-3, C3-4, and C5-6. Electronically Signed   By: Van Clines M.D.   On: 06/27/2022 15:35   CT Maxillofacial Wo Contrast  Result Date: 06/27/2022 CLINICAL DATA:  Fall from a ladder with laceration to the mid forehead. EXAM: CT HEAD WITHOUT CONTRAST CT MAXILLOFACIAL WITHOUT CONTRAST CT CERVICAL SPINE WITHOUT CONTRAST TECHNIQUE: Multidetector CT imaging of the head, cervical spine, and maxillofacial structures were performed using the standard protocol without intravenous contrast. Multiplanar CT image reconstructions of the  cervical spine and maxillofacial structures were also generated. RADIATION DOSE REDUCTION: This exam was performed according to the departmental dose-optimization program which includes automated exposure control, adjustment of the mA and/or kV according to patient size and/or use of iterative reconstruction technique. COMPARISON:  01/26/2010 FINDINGS: CT HEAD FINDINGS Brain: Remote right inferior cerebellar infarct. Chronic fluid density prominence below the left into or EM, suspected encephalomalacia in this vicinity versus arachnoid cyst, as shown in 2011. Otherwise, the brainstem, cerebellum, cerebral peduncles, thalamus, basal ganglia, basilar cisterns, and ventricular system appear within normal limits. No intracranial hemorrhage, mass lesion, or acute CVA. Vascular: There is atherosclerotic calcification of the cavernous carotid arteries bilaterally. Skull: Unremarkable Other: Substantial suspected anterior laceration along the forehead. CT MAXILLOFACIAL FINDINGS Osseous: No acute facial fracture is identified. 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 Soft tissues: Midline forehead scalp laceration. CT CERVICAL SPINE FINDINGS Alignment: 2 mm of degenerative retrolisthesis at C3-4, unchanged from 01/27/2020. Skull base and vertebrae: Klippel-Feil deformity, with C1 fused with C2, and with congenital interbody and posterior element fusion at C7-T1, not changed from the prior exam. Progressive loss of intervertebral disc space and endplate irregularity at the C2-3 level. 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 No cervical spine fracture or acute subluxation identified. Soft tissues and spinal canal: Bilateral common carotid atherosclerotic vascular disease. Disc levels: Mild osseous foraminal stenosis on the left at C2-3 and C3-4 due to uncinate and facet spurring. There is right foraminal  stenosis at C5-6 due to uncinate spurring. Upper chest: Unremarkable Other: No supplemental non-categorized findings. IMPRESSION: 1. Laceration along the midline scalp in the forehead. 2. No acute intracranial findings, acute cervical spine findings, or acute facial fracture. 3. 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 4. Remote right inferior cerebellar infarct. Fluid density along the left upper cerebellum potentially from cystic encephalomalacia or arachnoid cyst, neither changed from 2011. 5. 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 6. Cervical spondylosis cause foraminal impingement C2-3, C3-4, and C5-6. Electronically Signed   By: Van Clines M.D.   On: 06/27/2022 15:35   DG Hand Complete Right  Result Date: 06/27/2022 CLINICAL DATA:  Fall with thumb laceration EXAM: RIGHT HAND - COMPLETE 3+ VIEW COMPARISON:  None Available. FINDINGS: There is no acute fracture or dislocation of the thumb. There is a tiny ossific fragment adjacent to the scaphoid. Bony alignment is normal. The joint spaces are preserved. There is no erosive change. There is a soft tissue injury over the distal tuft of the thumb. There is no soft tissue gas or radiopaque foreign body. IMPRESSION: 1. Soft-tissue injury of the distal tuft of the left thumb. No soft tissue gas or radiopaque foreign body. 2. No acute fracture or dislocation in the thumb. 3. Tiny ossific fragment adjacent to the scaphoid is indeterminate. Correlate with point tenderness. Electronically Signed   By: Valetta Mole M.D.   On: 06/27/2022 14:24    Procedures .Marland KitchenLaceration Repair  Date/Time: 06/27/2022 5:54 PM  Performed by: Anselmo Pickler, PA-C Authorized by: Anselmo Pickler, PA-C   Consent:    Consent obtained:  Verbal   Consent given by:  Patient   Risks, benefits, and alternatives were discussed: yes     Risks discussed:  Infection, pain,  poor cosmetic result, poor wound healing, need for additional repair, tendon damage and retained foreign body   Alternatives discussed:  No treatment Universal protocol:    Procedure explained and questions answered to patient or proxy's satisfaction: yes     Patient identity confirmed:  Verbally with patient Anesthesia:    Anesthesia method:  Local infiltration   Local anesthetic:  Lidocaine 1% w/o epi Laceration details:    Location:  Scalp   Scalp location:  Frontal   Length (cm):  5.5   Depth (mm):  8 Exploration:    Wound exploration: wound explored through full range of motion and entire depth of wound visualized   Treatment:    Area cleansed with:  Saline and chlorhexidine   Amount of cleaning:  Standard   Layers/structures repaired:  Janae Sauce:    Suture size:  4-0   Suture material:  Vicryl   Suture technique:  Simple interrupted   Number of sutures:  1 Skin repair:    Repair method:  Sutures   Suture size:  4-0   Suture material:  Prolene   Suture technique:  Simple interrupted   Number of sutures:  7 Approximation:    Approximation:  Close Repair type:    Repair type:  Intermediate Post-procedure details:    Dressing:  Antibiotic ointment and non-adherent dressing   Procedure completion:  Tolerated .Marland KitchenLaceration Repair  Date/Time:  06/27/2022 5:56 PM  Performed by: Anselmo Pickler, PA-C Authorized by: Anselmo Pickler, PA-C   Consent:    Consent obtained:  Verbal   Consent given by:  Patient   Risks, benefits, and alternatives were discussed: yes     Risks discussed:  Infection, poor cosmetic result, pain, poor wound healing, need for additional repair and retained foreign body   Alternatives discussed:  No treatment Universal protocol:    Procedure explained and questions answered to patient or proxy's satisfaction: yes     Patient identity confirmed:  Verbally with patient Anesthesia:    Anesthesia method:  Nerve block   Block location:   Right thumb   Block needle gauge:  25 G   Block anesthetic:  Lidocaine 1% w/o epi   Block technique:  Ring block   Block injection procedure:  Anatomic landmarks identified, introduced needle, negative aspiration for blood, anatomic landmarks palpated and incremental injection   Block outcome:  Anesthesia achieved Laceration details:    Location:  Finger   Finger location:  R thumb   Length (cm):  2.5   Depth (mm):  4 Treatment:    Area cleansed with:  Povidone-iodine, saline and Shur-Clens   Amount of cleaning:  Standard Skin repair:    Repair method:  Sutures   Suture size:  4-0   Suture material:  Chromic gut   Suture technique:  Simple interrupted   Number of sutures:  2 Approximation:    Approximation:  Loose Repair type:    Repair type:  Simple Post-procedure details:    Dressing:  Antibiotic ointment and bulky dressing   Procedure completion:  Tolerated Comments:     Patient has subungual hematoma which was trephinated with a Bovie, he had positive release of blood after trephination.  He had a devascularize flap of tissue on the right thumb which we pulled over the area of exposed dermis.  Bleeding was controlled prior to discharge, we dressed it with a Vaseline impregnated gauze, bulky dressing.  Wound closure was somewhat loose, discussed with patient that I do not expect this skin tissue to live     Medications Ordered in ED Medications  lidocaine (PF) (XYLOCAINE) 1 % injection 30 mL (30 mLs Infiltration Given by Other 06/27/22 1556)  Tdap (BOOSTRIX) injection 0.5 mL (0.5 mLs Intramuscular Given 06/27/22 1528)  potassium chloride (KLOR-CON) packet 20 mEq (20 mEq Oral Given 06/27/22 1527)    ED Course/ Medical Decision Making/ A&P                           Medical Decision Making Amount and/or Complexity of Data Reviewed Labs: ordered. Radiology: ordered.  Risk Prescription drug management.   This patient is a 71 y.o. male who presents to the ED for concern  of fall from ladder, head injury, right thumb injury, and last use, this involves an extensive number of treatment options, and is a complaint that carries with it a high risk of complications and morbidity. The emergent differential diagnosis prior to evaluation includes, but is not limited to, fracture, dislocation, lacerations requiring repair, foreign body, missing tooth that needs to be replaced, intracranial bleed, subdural hematoma, epidural hematoma, unstable spine fracture, occult lumbar spine fracture secondary to fall versus other.   This is not an exhaustive differential.   Past Medical History / Co-morbidities / Social History: Overall noncontributory, patient not taking blood thinners  Physical Exam: Physical exam performed. The pertinent findings include:  No tenderness to palpation, step-off, deformity of bilateral shoulders, hips, midline spine, arms, legs.  Very tender on right thumb, questionable step-off.  Patient is ambulatory without difficulty.  Intact strength out of 5 bilateral upper and lower extremities.  Patient has intact flexion extension of the right thumb at all feeling segments without difficulty.  Some soft tissue swelling at the distal thumb.  Large laceration noted frontal forehead, approximately 5cm in length, at least 75m penetration, galea is violated, approximately 1cm small lower laceration noted, repaired with vicryl as discussed above.  Right thumb with obvious subungual hematoma, which was relieved with trephination.  He has a flap of devascularized tissue at the tip of the right thumb, see pictures in media tab for further information, there is no obvious subungual lacerations we will not remove.  Neurovascularly intact throughout  Lab Tests: I ordered, and personally interpreted labs.  The pertinent results include: CBC unremarkable, BMP with very mild hypokalemia.  We will orally replete.   Imaging Studies: I ordered imaging studies including ct  lumbar spine wo contrast, CT head without contrast, CT cervical spine without contrast, CT maxillofacial without contrast and plain film radiograph of the right hand. I independently visualized and interpreted imaging which showed soft tissue injury of the right palm, no other fractures or dislocations, congenital fusions in the cervical spine as noted in radiology report above. I agree with the radiologist interpretation.   Medications: I ordered medication including potassium for mild hypokalemia, updated tetanus for Tdap prophylaxis, lidocaine for suture repair . Reevaluation of the patient after these medicines showed that the patient improved. I have reviewed the patients home medicines and have made adjustments as needed.   Disposition: After consideration of the diagnostic results and the patients response to treatment, I feel that she is stable for discharge, recommend that he follow-up with a hand surgeon regarding his thumb, discussed that he is likely to lose the nail, as well as the skin flap that we tacked.  He will need to return for suture removal in his forehead.  He will need to see the dentist for his broken tooth.  Otherwise encouraged ibuprofen, Tylenol, rest, ice, elevation of any painful extremities.   emergency department workup does not suggest an emergent condition requiring admission or immediate intervention beyond what has been performed at this time. The plan is: As above. The patient is safe for discharge and has been instructed to return immediately for worsening symptoms, change in symptoms or any other concerns.  I discussed this case with my attending physician Dr. GRegenia Skeeterwho cosigned this note including patient's presenting symptoms, physical exam, and planned diagnostics and interventions. Attending physician stated agreement with plan or made changes to plan which were implemented.    Final Clinical Impression(s) / ED Diagnoses Final diagnoses:  Fall, initial  encounter  Laceration of scalp, initial encounter  Injury of head, initial encounter  Laceration of right thumb without foreign body with damage to nail, initial encounter  Subungual hematoma of right thumb, initial encounter    Rx / DC Orders ED Discharge Orders          Ordered    cephALEXin (KEFLEX) 500 MG capsule  4 times daily        06/27/22 1725              PCarlus PavlovCVolga PA-C 06/27/22 1814    ZMilton Ferguson MD 06/28/22 [id number]

## 2022-06-27 NOTE — Discharge Instructions (Addendum)
Please use Tylenol or ibuprofen for pain.  You may use 600 mg ibuprofen every 6 hours or 1000 mg of Tylenol every 6 hours.  You may choose to alternate between the 2.  This would be most effective.  Not to exceed 4 g of Tylenol within 24 hours.  Not to exceed 3200 mg ibuprofen 24 hours.  Keep the dressing in place on the thumb for 24-48 hours, then redress with gauze, neosporin or polysporin, and follow up with the hand surgeon as soon as possible.  Follow up with your dentist as soon as possible. Monitor for any signs of a wound infection such as worsening pain, redness, pus draining from the affected sites.  You need to have the sutures in the forehead removed in 7 days. This can be done by your PCP, urgent care, or the ED.

## 2022-06-27 NOTE — ED Notes (Signed)
Suture cart at bedside 

## 2022-06-28 ENCOUNTER — Telehealth: Payer: Self-pay | Admitting: *Deleted

## 2022-06-28 NOTE — Telephone Encounter (Signed)
CALLED PATIENT TO INFORM OF FID. MARKER PLACEMENT ON 08-21-22 @ 9 AM @ DR. Letta Kocher OFFICE IN [place] AND HIS SIM ON 08-23-22 - ARRIVAL TIME- 2:45 PM @ [place], INFORMED PATIENT TO ARRIVE WITH A FULL BLADDER, SPOKE WITH PATIENT AND HE IS AWARE OF THESE APPTS. AND THE INSTRUCTIONS

## 2022-06-29 ENCOUNTER — Encounter (HOSPITAL_COMMUNITY): Payer: Self-pay | Admitting: Emergency Medicine

## 2022-06-29 ENCOUNTER — Other Ambulatory Visit: Payer: Self-pay

## 2022-06-29 ENCOUNTER — Emergency Department (HOSPITAL_COMMUNITY)
Admission: EM | Admit: 2022-06-29 | Discharge: 2022-06-29 | Disposition: A | Discharge status: Home / Self Care | Payer: Medicare PPO | Attending: Emergency Medicine | Admitting: Emergency Medicine

## 2022-06-29 DIAGNOSIS — H05221 Edema of right orbit: Secondary | ICD-10-CM | POA: Diagnosis not present

## 2022-06-29 DIAGNOSIS — R600 Localized edema: Secondary | ICD-10-CM

## 2022-06-29 NOTE — ED Triage Notes (Signed)
Pt to the ED with right eye that began this morning.  Pt was seen here 8/24 with a scalp laceration.

## 2022-06-29 NOTE — ED Provider Notes (Signed)
[facility] EMERGENCY DEPARTMENT Provider Note   CSN: [id number] Arrival date & time: 06/29/22  1656     History  Chief Complaint  Patient presents with   Facial Swelling    Walter Harrison is a 71 y.o. male presenting to ED for wound check.  The patient was seen in ED 3 days ago after fall off a ladder where he had a forehead laceration was repaired with sutures, reports that he has noted swelling in his right eye since then.  His vision is fine.  No other complaints.  HPI     Home Medications Prior to Admission medications   Medication Sig Start Date End Date Taking? Authorizing Provider  allopurinol (ZYLOPRIM) 300 MG tablet Take 300 mg by mouth daily. 05/18/22   [provider]  amoxicillin-clavulanate (AUGMENTIN) 500-125 MG tablet SMARTSIG:1 Tablet(s) By Mouth Every 12 Hours 06/12/22   [provider]  aspirin 81 MG chewable tablet Chew 81 mg by mouth daily after breakfast.    [provider]  cephALEXin (KEFLEX) 500 MG capsule Take 1 capsule (500 mg total) by mouth 4 (four) times daily. 06/27/22   Prosperi, Christian H, PA-C  chlorthalidone (HYGROTON) 25 MG tablet Take 12.5 mg by mouth daily after breakfast.     [provider]  Multiple Vitamin (MULTIVITAMIN WITH MINERALS) TABS tablet Take 1 tablet by mouth daily after breakfast. Centrum Silver    [provider]  tamsulosin (FLOMAX) 0.4 MG CAPS capsule SMARTSIG:1 Capsule(s) By Mouth Every Evening 06/10/22   [provider]      Allergies    Sulfa antibiotics    Review of Systems   Review of Systems  Physical Exam Updated Vital Signs BP 121/72   Pulse 91   Temp (!) 97.5 F (36.4 C) (Oral)   Resp 18   Ht '5\' 8"'$  (1.727 m)   Wt 83.9 kg   SpO2 94%   BMI 28.13 kg/m  Physical Exam Constitutional:      General: He is not in acute distress. HENT:     Head: Normocephalic.     Comments: Forehead sutures appear clean and intact, there is some right periorbital edema Eyes:      Conjunctiva/sclera: Conjunctivae normal.     Pupils: Pupils are equal, round, and reactive to light.  Cardiovascular:     Rate and Rhythm: Normal rate and regular rhythm.  Pulmonary:     Effort: Pulmonary effort is normal. No respiratory distress.  Abdominal:     General: There is no distension.     Tenderness: There is no abdominal tenderness.  Skin:    General: Skin is warm and dry.  Neurological:     General: No focal deficit present.     Mental Status: He is alert. Mental status is at baseline.  Psychiatric:        Mood and Affect: Mood normal.        Behavior: Behavior normal.     ED Results / Procedures / Treatments   Labs (all labs ordered are listed, but only abnormal results are displayed) Labs Reviewed - No data to display  EKG None  Radiology No results found.  Procedures Procedures    Medications Ordered in ED Medications - No data to display  ED Course/ Medical Decision Making/ A&P                           Medical Decision Making  Patient is here with some  mild right periorbital edema after laceration repair 3 days ago.  I told him this is likely a combination of fluid and migration of blood products due to gravity, it is common to accumulate around the eye.  He may develop a black and blue eye for the next several days.  They can continue applying ice and compressions.  This is not an infection.  He otherwise looks well and has no other acute complaints.  He has follow-up with his doctor and had stitches removed this week.  Okay for discharge        Final Clinical Impression(s) / ED Diagnoses Final diagnoses:  Periorbital edema of right eye    Rx / DC Orders ED Discharge Orders     None         Emmaus Brandi, Carola Rhine, MD 06/29/22 [phone]

## 2022-07-02 NOTE — Progress Notes (Signed)
RN spoke with patient to confirm start of Orgovyx.   Education provided on next steps for fiducial marker's, and CT Simulation.   Pt is scheduled with Dr. Farrel Gordon on 08/21/2022 for fiducial's, and CT Simulation on 08/23/2022.    All questions answered, no additional needs at this time.

## 2022-07-17 MED ORDER — MORPHINE SULFATE (PF) 10 MG/ML IV SOLN: 10.0000 mg | Freq: Once | INTRAVENOUS | Status: AC

## 2022-07-17 MED ORDER — DIPHENHYDRAMINE HCL 25 MG PO CAPS: 25.0000 mg | ORAL_CAPSULE | ORAL | Status: AC

## 2022-07-17 MED ORDER — LEVOFLOXACIN 500 MG PO TABS: 500.0000 mg | ORAL_TABLET | Freq: Once | ORAL | Status: AC

## 2022-07-17 MED ORDER — MIDAZOLAM HCL 2 MG/2ML IJ SOLN: 1.0000 mg | Freq: Once | INTRAMUSCULAR | Status: AC

## 2022-07-17 MED ORDER — DEXTROSE-NACL 5-0.45 % IV SOLN: INTRAVENOUS | Status: DC

## 2022-07-17 MED ORDER — PROMETHAZINE HCL 25 MG/ML IJ SOLN: 25.0000 mg | Freq: Once | INTRAMUSCULAR | Status: AC

## 2022-07-18 ENCOUNTER — Encounter
Admission: RE | Disposition: A | Discharge status: Home / Self Care | Payer: Self-pay | Source: Ambulatory Visit | Attending: Urology

## 2022-07-18 ENCOUNTER — Ambulatory Visit: Payer: Medicare PPO

## 2022-07-18 ENCOUNTER — Other Ambulatory Visit: Payer: Self-pay

## 2022-07-18 ENCOUNTER — Encounter: Payer: Self-pay | Admitting: Urology

## 2022-07-18 ENCOUNTER — Ambulatory Visit
Admission: RE | Admit: 2022-07-18 | Discharge: 2022-07-18 | Disposition: A | Discharge status: Home / Self Care | Payer: Medicare PPO | Source: Ambulatory Visit | Attending: Urology | Admitting: Urology

## 2022-07-18 DIAGNOSIS — N200 Calculus of kidney: Secondary | ICD-10-CM | POA: Diagnosis present

## 2022-07-18 HISTORY — PX: EXTRACORPOREAL SHOCK WAVE LITHOTRIPSY: SHX1557

## 2022-07-18 SURGERY — LITHOTRIPSY, ESWL
Anesthesia: Moderate Sedation | Laterality: Left

## 2022-07-18 MED ORDER — PROMETHAZINE HCL 25 MG/ML IJ SOLN
INTRAMUSCULAR | Status: AC
  Administered 2022-07-18: 25 mg via INTRAMUSCULAR
  Filled 2022-07-18: qty 1

## 2022-07-18 MED ORDER — ACETAMINOPHEN-CODEINE 300-30 MG PO TABS: 1.0000 | ORAL_TABLET | Freq: Four times a day (QID) | ORAL | 2 refills | Status: DC | PRN

## 2022-07-18 MED ORDER — FUROSEMIDE 10 MG/ML IJ SOLN: 10.0000 mg | Freq: Once | INTRAMUSCULAR | Status: AC

## 2022-07-18 MED ORDER — FUROSEMIDE 10 MG/ML IJ SOLN
INTRAMUSCULAR | Status: AC
  Administered 2022-07-18: 10 mg via INTRAVENOUS
  Filled 2022-07-18: qty 2

## 2022-07-18 MED ORDER — MIDAZOLAM HCL 2 MG/2ML IJ SOLN
INTRAMUSCULAR | Status: AC
  Administered 2022-07-18: 1 mg via INTRAMUSCULAR
  Filled 2022-07-18: qty 2

## 2022-07-18 MED ORDER — LEVOFLOXACIN 500 MG PO TABS
ORAL_TABLET | ORAL | Status: AC
  Administered 2022-07-18: 500 mg via ORAL
  Filled 2022-07-18: qty 1

## 2022-07-18 MED ORDER — CIPROFLOXACIN HCL 500 MG PO TABS: 500.0000 mg | ORAL_TABLET | Freq: Two times a day (BID) | ORAL | 0 refills | Status: DC

## 2022-07-18 MED ORDER — MORPHINE SULFATE (PF) 10 MG/ML IV SOLN
INTRAVENOUS | Status: AC
  Administered 2022-07-18: 10 mg via INTRAMUSCULAR
  Filled 2022-07-18: qty 1

## 2022-07-18 MED ORDER — TAMSULOSIN HCL 0.4 MG PO CAPS: 0.4000 mg | ORAL_CAPSULE | Freq: Every day | ORAL | 0 refills | Status: DC

## 2022-07-18 MED ORDER — ONDANSETRON 8 MG PO TBDP: 4.0000 mg | ORAL_TABLET | Freq: Four times a day (QID) | ORAL | 3 refills | Status: DC | PRN

## 2022-07-18 MED ORDER — DIPHENHYDRAMINE HCL 25 MG PO CAPS
ORAL_CAPSULE | ORAL | Status: AC
  Administered 2022-07-18: 25 mg via ORAL
  Filled 2022-07-18: qty 1

## 2022-07-18 NOTE — Discharge Instructions (Addendum)
Lithotripsy, Care After This sheet gives you information about how to care for yourself after your procedure. Your health care provider may also give you more specific instructions. If you have problems or questions, contact your health care provider. What can I expect after the procedure? After the procedure, it is common to have: Some blood in your urine. This should only last for a few days. Soreness in your back, sides, or upper abdomen for a few days. Blotches or bruises on the area where the shock wave entered the skin. Pain, discomfort, or nausea when pieces (fragments) of the kidney stone move through the tube that carries urine from the kidney to the bladder (ureter). Stone fragments may pass soon after the procedure, but they may continue to pass for up to 4-8 weeks. If you have severe pain or nausea, contact your health care provider. This may be caused by a large stone that was not broken up, and this may mean that you need more treatment. Some pain or discomfort during urination. Some pain or discomfort in the lower abdomen or (in men) at the base of the penis. Follow these instructions at home: Medicines Take over-the-counter and prescription medicines only as told by your health care provider. If you were prescribed an antibiotic medicine, take it as told by your health care provider. Do not stop taking the antibiotic even if you start to feel better. Ask your health care provider if the medicine prescribed to you requires you to avoid driving or using machinery. Eating and drinking A comparison of three sample cups showing dark yellow, yellow, and pale yellow urine.     A plate with examples of foods in a healthy diet.   Drink enough fluid to keep your urine pale yellow. This helps any remaining pieces of the stone to pass. It can also help prevent new stones from forming. Eat plenty of fresh fruits and vegetables. Follow instructions from your health care provider about  eating or drinking restrictions. You may be instructed to: Reduce how much salt (sodium) you eat or drink. Check ingredients and nutrition facts on packaged foods and beverages to see how much sodium they contain. Reduce how much meat you eat. Eat the recommended amount of calcium for your age and gender. Ask your health care provider how much calcium you should have. General instructions Get plenty of rest. Return to your normal activities as told by your health care provider. Ask your health care provider what activities are safe for you. Most people can resume normal activities 1-2 days after the procedure. If you were given a sedative during the procedure, it can affect you for several hours. Do not drive or operate machinery until your health care provider says that it is safe. Your health care provider may direct you to lie in a certain position (postural drainage) and tap firmly (percuss) over your kidney area to help stone fragments pass. Follow instructions as told by your health care provider. If directed, strain all urine through the strainer that was provided by your health care provider. Keep all fragments for your health care provider to see. Any stones that are found may be sent to a medical lab for examination. The stone may be as small as a grain of salt. Keep all follow-up visits as told by your health care provider. This is important. Contact a health care provider if: You have a fever or chills. You have nausea that is severe or does not go away. You have any   of these urinary symptoms: Blood in your urine for longer than your health care provider told you to expect. Urine that smells bad or unusual. Feeling a strong urge to urinate after emptying your bladder. Pain or burning with urination that does not go away. Urinating more often than usual and this does not go away. You have a stent and it comes out. Get help right away if: You have severe pain in your back, sides, or  upper abdomen. You have any of these urinary symptoms: Severe pain while urinating. More blood in your urine or having blood in your urine when you did not before. Passing blood clots in your urine. Passing only a small amount of urine or being unable to pass any urine at all. You have severe nausea that leads to persistent vomiting. You faint. Summary After this procedure, it is common to have some pain, discomfort, or nausea when pieces (fragments) of the kidney stone move through the tube that carries urine from the kidney to the bladder (ureter). If this pain or nausea is severe, however, you should contact your health care provider. Return to your normal activities as told by your health care provider. Ask your health care provider what activities are safe for you. Drink enough fluid to keep your urine pale yellow. This helps any remaining pieces of the stone to pass, and it can help prevent new stones from forming. If directed, strain your urine and keep all fragments for your health care provider to see. Fragments or stones may be as small as a grain of salt. Get help right away if you have severe pain in your back, sides, or upper abdomen, or if you have severe pain while urinating. This information is not intended to replace advice given to you by your health care provider. Make sure you discuss any questions you have with your health care provider. Document Revised: 09/17/2021 Document Reviewed: 06/25/2021 Elsevier Patient Education  2023 Elsevier Inc.     AMBULATORY SURGERY  DISCHARGE INSTRUCTIONS   The drugs that you were given will stay in your system until tomorrow so for the next 24 hours you should not:  Drive an automobile Make any legal decisions Drink any alcoholic beverage   You may resume regular meals tomorrow.  Today it is better to start with liquids and gradually work up to solid foods.  You may eat anything you prefer, but it is better to start with liquids,  then soup and crackers, and gradually work up to solid foods.   Please notify your doctor immediately if you have any unusual bleeding, trouble breathing, redness and pain at the surgery site, drainage, fever, or pain not relieved by medication.    Additional Instructions:     Please contact your physician with any problems or Same Day Surgery at [phone], Monday through Friday 6 am to 4 pm, or [place] [facility] Main number at [phone].  

## 2022-07-19 ENCOUNTER — Encounter: Payer: Self-pay | Admitting: Urology

## 2022-08-22 ENCOUNTER — Telehealth: Payer: Self-pay | Admitting: *Deleted

## 2022-08-22 NOTE — Telephone Encounter (Signed)
CALLED PATIENT TO REMIND OF SIM APPT.  FOR 08-23-22- ARRIVAL TIME- 2:45 PM @ [facility], INFORMED PATIENT TO ARRIVE WITH A FULL BLADDER, SPOKE WITH PATIENT AND HE IS AWARE OF THIS APPT. AND THE INSTRUCTIONS

## 2022-08-23 ENCOUNTER — Other Ambulatory Visit: Payer: Self-pay

## 2022-08-23 ENCOUNTER — Ambulatory Visit
Admission: RE | Admit: 2022-08-23 | Discharge: 2022-08-23 | Disposition: A | Discharge status: Home / Self Care | Payer: Medicare PPO | Source: Ambulatory Visit | Attending: Radiation Oncology | Admitting: Radiation Oncology

## 2022-08-23 DIAGNOSIS — C61 Malignant neoplasm of prostate: Secondary | ICD-10-CM | POA: Diagnosis present

## 2022-08-23 DIAGNOSIS — Z510 Encounter for antineoplastic radiation therapy: Secondary | ICD-10-CM | POA: Diagnosis present

## 2022-08-23 NOTE — Progress Notes (Signed)
  Radiation Oncology         [phone] ________________________________  Name: Walter Harrison MRN: [id number]  Date: 08/23/2022  DOB: [birth year]  SIMULATION AND TREATMENT PLANNING NOTE    ICD-10-CM   1. Malignant neoplasm of prostate ([place])  C61       DIAGNOSIS:  71 y.o. 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  NARRATIVE:  The patient was brought to the [facility].  Identity was confirmed.  All relevant records and images related to the planned course of therapy were reviewed.  The patient freely provided informed written consent to proceed with treatment after reviewing the details related to the planned course of therapy. The consent form was witnessed and verified by the simulation staff.  Then, the patient was set-up in a stable reproducible supine position for radiation therapy.  A vacuum lock pillow device was custom fabricated to position his legs in a reproducible immobilized position.  Then, I performed a urethrogram under sterile conditions to identify the prostatic bed.  CT images were obtained.  Surface markings were placed.  The CT images were loaded into the planning software.  Then the prostate bed target, pelvic lymph node target and avoidance structures including the rectum, bladder, bowel and hips were contoured.  Treatment planning then occurred.  The radiation prescription was entered and confirmed.  A total of one complex treatment devices were fabricated. I have requested : Intensity Modulated Radiotherapy (IMRT) is medically necessary for this case for the following reason:  Rectal sparing.Marland Kitchen  PLAN:  The patient will receive 45 Gy in 25 fractions of 1.8 Gy, followed by a boost to the prostate to a total dose of 75 Gy with 15 additional fractions of 2 Gy.   ________________________________  Sheral Apley Tammi Klippel, M.D.

## 2022-08-27 DIAGNOSIS — Z510 Encounter for antineoplastic radiation therapy: Secondary | ICD-10-CM | POA: Diagnosis not present

## 2022-09-03 ENCOUNTER — Other Ambulatory Visit: Payer: Self-pay

## 2022-09-03 ENCOUNTER — Ambulatory Visit
Admission: RE | Admit: 2022-09-03 | Discharge: 2022-09-03 | Disposition: A | Discharge status: Home / Self Care | Payer: Medicare PPO | Source: Ambulatory Visit | Attending: Radiation Oncology | Admitting: Radiation Oncology

## 2022-09-03 DIAGNOSIS — Z510 Encounter for antineoplastic radiation therapy: Secondary | ICD-10-CM | POA: Diagnosis not present

## 2022-09-03 LAB — RAD ONC ARIA SESSION SUMMARY
Course Elapsed Days: 0
Plan Fractions Treated to Date: 1
Plan Prescribed Dose Per Fraction: 1.8 Gy
Plan Total Fractions Prescribed: 25
Plan Total Prescribed Dose: 45 Gy
Reference Point Dosage Given to Date: 1.8 Gy
Reference Point Session Dosage Given: 1.8 Gy
Session Number: 1

## 2022-09-04 ENCOUNTER — Ambulatory Visit
Admission: RE | Admit: 2022-09-04 | Discharge: 2022-09-04 | Disposition: A | Discharge status: Home / Self Care | Payer: Medicare PPO | Source: Ambulatory Visit | Attending: Radiation Oncology | Admitting: Radiation Oncology

## 2022-09-04 ENCOUNTER — Other Ambulatory Visit: Payer: Self-pay

## 2022-09-04 DIAGNOSIS — Z510 Encounter for antineoplastic radiation therapy: Secondary | ICD-10-CM | POA: Diagnosis not present

## 2022-09-04 DIAGNOSIS — C61 Malignant neoplasm of prostate: Secondary | ICD-10-CM | POA: Diagnosis present

## 2022-09-04 LAB — RAD ONC ARIA SESSION SUMMARY
Course Elapsed Days: 1
Plan Fractions Treated to Date: 2
Plan Prescribed Dose Per Fraction: 1.8 Gy
Plan Total Fractions Prescribed: 25
Plan Total Prescribed Dose: 45 Gy
Reference Point Dosage Given to Date: 3.6 Gy
Reference Point Session Dosage Given: 1.8 Gy
Session Number: 2

## 2022-09-05 ENCOUNTER — Other Ambulatory Visit: Payer: Self-pay

## 2022-09-05 ENCOUNTER — Ambulatory Visit
Admission: RE | Admit: 2022-09-05 | Discharge: 2022-09-05 | Disposition: A | Discharge status: Home / Self Care | Payer: Medicare PPO | Source: Ambulatory Visit | Attending: Radiation Oncology | Admitting: Radiation Oncology

## 2022-09-05 DIAGNOSIS — Z510 Encounter for antineoplastic radiation therapy: Secondary | ICD-10-CM | POA: Diagnosis not present

## 2022-09-05 LAB — RAD ONC ARIA SESSION SUMMARY
Course Elapsed Days: 2
Plan Fractions Treated to Date: 3
Plan Prescribed Dose Per Fraction: 1.8 Gy
Plan Total Fractions Prescribed: 25
Plan Total Prescribed Dose: 45 Gy
Reference Point Dosage Given to Date: 5.4 Gy
Reference Point Session Dosage Given: 1.8 Gy
Session Number: 3

## 2022-09-06 ENCOUNTER — Ambulatory Visit
Admission: RE | Admit: 2022-09-06 | Discharge: 2022-09-06 | Disposition: A | Discharge status: Home / Self Care | Payer: Medicare PPO | Source: Ambulatory Visit | Attending: Radiation Oncology | Admitting: Radiation Oncology

## 2022-09-06 ENCOUNTER — Other Ambulatory Visit: Payer: Self-pay

## 2022-09-06 DIAGNOSIS — Z510 Encounter for antineoplastic radiation therapy: Secondary | ICD-10-CM | POA: Diagnosis not present

## 2022-09-06 LAB — RAD ONC ARIA SESSION SUMMARY
Course Elapsed Days: 3
Plan Fractions Treated to Date: 4
Plan Prescribed Dose Per Fraction: 1.8 Gy
Plan Total Fractions Prescribed: 25
Plan Total Prescribed Dose: 45 Gy
Reference Point Dosage Given to Date: 7.2 Gy
Reference Point Session Dosage Given: 1.8 Gy
Session Number: 4

## 2022-09-09 ENCOUNTER — Other Ambulatory Visit: Payer: Self-pay

## 2022-09-09 ENCOUNTER — Ambulatory Visit
Admission: RE | Admit: 2022-09-09 | Discharge: 2022-09-09 | Disposition: A | Discharge status: Home / Self Care | Payer: Medicare PPO | Source: Ambulatory Visit | Attending: Radiation Oncology | Admitting: Radiation Oncology

## 2022-09-09 DIAGNOSIS — Z510 Encounter for antineoplastic radiation therapy: Secondary | ICD-10-CM | POA: Diagnosis not present

## 2022-09-09 LAB — RAD ONC ARIA SESSION SUMMARY
Course Elapsed Days: 6
Plan Fractions Treated to Date: 5
Plan Prescribed Dose Per Fraction: 1.8 Gy
Plan Total Fractions Prescribed: 25
Plan Total Prescribed Dose: 45 Gy
Reference Point Dosage Given to Date: 9 Gy
Reference Point Session Dosage Given: 1.8 Gy
Session Number: 5

## 2022-09-10 ENCOUNTER — Ambulatory Visit
Admission: RE | Admit: 2022-09-10 | Discharge: 2022-09-10 | Disposition: A | Discharge status: Home / Self Care | Payer: Medicare PPO | Source: Ambulatory Visit | Attending: Radiation Oncology | Admitting: Radiation Oncology

## 2022-09-10 ENCOUNTER — Other Ambulatory Visit: Payer: Self-pay

## 2022-09-10 DIAGNOSIS — Z510 Encounter for antineoplastic radiation therapy: Secondary | ICD-10-CM | POA: Diagnosis not present

## 2022-09-10 LAB — RAD ONC ARIA SESSION SUMMARY
Course Elapsed Days: 7
Plan Fractions Treated to Date: 6
Plan Prescribed Dose Per Fraction: 1.8 Gy
Plan Total Fractions Prescribed: 25
Plan Total Prescribed Dose: 45 Gy
Reference Point Dosage Given to Date: 10.8 Gy
Reference Point Session Dosage Given: 0.1246 Gy
Session Number: 6

## 2022-09-11 ENCOUNTER — Other Ambulatory Visit: Payer: Self-pay

## 2022-09-11 ENCOUNTER — Ambulatory Visit
Admission: RE | Admit: 2022-09-11 | Discharge: 2022-09-11 | Disposition: A | Discharge status: Home / Self Care | Payer: Medicare PPO | Source: Ambulatory Visit | Attending: Radiation Oncology | Admitting: Radiation Oncology

## 2022-09-11 DIAGNOSIS — Z510 Encounter for antineoplastic radiation therapy: Secondary | ICD-10-CM | POA: Diagnosis not present

## 2022-09-11 LAB — RAD ONC ARIA SESSION SUMMARY
Course Elapsed Days: 8
Plan Fractions Treated to Date: 7
Plan Prescribed Dose Per Fraction: 1.8 Gy
Plan Total Fractions Prescribed: 25
Plan Total Prescribed Dose: 45 Gy
Reference Point Dosage Given to Date: 12.6 Gy
Reference Point Session Dosage Given: 1.8 Gy
Session Number: 7

## 2022-09-12 ENCOUNTER — Other Ambulatory Visit: Payer: Self-pay

## 2022-09-12 ENCOUNTER — Ambulatory Visit
Admission: RE | Admit: 2022-09-12 | Discharge: 2022-09-12 | Disposition: A | Discharge status: Home / Self Care | Payer: Medicare PPO | Source: Ambulatory Visit | Attending: Radiation Oncology | Admitting: Radiation Oncology

## 2022-09-12 DIAGNOSIS — Z510 Encounter for antineoplastic radiation therapy: Secondary | ICD-10-CM | POA: Diagnosis not present

## 2022-09-12 LAB — RAD ONC ARIA SESSION SUMMARY
Course Elapsed Days: 9
Plan Fractions Treated to Date: 8
Plan Prescribed Dose Per Fraction: 1.8 Gy
Plan Total Fractions Prescribed: 25
Plan Total Prescribed Dose: 45 Gy
Reference Point Dosage Given to Date: 14.4 Gy
Reference Point Session Dosage Given: 1.8 Gy
Session Number: 8

## 2022-09-13 ENCOUNTER — Ambulatory Visit
Admission: RE | Admit: 2022-09-13 | Discharge: 2022-09-13 | Disposition: A | Discharge status: Home / Self Care | Payer: Medicare PPO | Source: Ambulatory Visit | Attending: Radiation Oncology | Admitting: Radiation Oncology

## 2022-09-13 ENCOUNTER — Other Ambulatory Visit: Payer: Self-pay

## 2022-09-13 DIAGNOSIS — C61 Malignant neoplasm of prostate: Secondary | ICD-10-CM

## 2022-09-13 DIAGNOSIS — Z510 Encounter for antineoplastic radiation therapy: Secondary | ICD-10-CM | POA: Diagnosis not present

## 2022-09-13 LAB — RAD ONC ARIA SESSION SUMMARY
Course Elapsed Days: 10
Plan Fractions Treated to Date: 9
Plan Prescribed Dose Per Fraction: 1.8 Gy
Plan Total Fractions Prescribed: 25
Plan Total Prescribed Dose: 45 Gy
Reference Point Dosage Given to Date: 16.2 Gy
Reference Point Session Dosage Given: 1.8 Gy
Session Number: 9

## 2022-09-13 MED ORDER — SONAFINE EX EMUL
1.0000 | Freq: Two times a day (BID) | CUTANEOUS | Status: DC
  Administered 2022-09-13: 1 via TOPICAL

## 2022-09-13 NOTE — Progress Notes (Signed)
Pt here for patient teaching. Pt given Radiation and You booklet, skin care instructions, and Sonafine. Reviewed areas of pertinence such as diarrhea, fatigue, hair loss, nausea and vomiting, sexual and fertility changes, skin changes, and urinary and bladder changes . Pt able to give teach back of to pat skin, use unscented/gentle soap, use baby wipes, have Imodium on hand, drink plenty of water, and sitz bath, apply Sonafine bid, avoid applying anything to skin within 4 hours of treatment, and to use an electric razor if they must shave. Pt verbalizes understanding of information given and will contact nursing with any questions or concerns.     [email or web address]

## 2022-09-16 ENCOUNTER — Ambulatory Visit
Admission: RE | Admit: 2022-09-16 | Discharge: 2022-09-16 | Disposition: A | Discharge status: Home / Self Care | Payer: Medicare PPO | Source: Ambulatory Visit | Attending: Radiation Oncology | Admitting: Radiation Oncology

## 2022-09-16 ENCOUNTER — Other Ambulatory Visit: Payer: Self-pay

## 2022-09-16 DIAGNOSIS — Z510 Encounter for antineoplastic radiation therapy: Secondary | ICD-10-CM | POA: Diagnosis not present

## 2022-09-16 LAB — RAD ONC ARIA SESSION SUMMARY
Course Elapsed Days: 13
Plan Fractions Treated to Date: 10
Plan Prescribed Dose Per Fraction: 1.8 Gy
Plan Total Fractions Prescribed: 25
Plan Total Prescribed Dose: 45 Gy
Reference Point Dosage Given to Date: 18 Gy
Reference Point Session Dosage Given: 1.8 Gy
Session Number: 10

## 2022-09-17 ENCOUNTER — Ambulatory Visit
Admission: RE | Admit: 2022-09-17 | Discharge: 2022-09-17 | Disposition: A | Discharge status: Home / Self Care | Payer: Medicare PPO | Source: Ambulatory Visit | Attending: Radiation Oncology | Admitting: Radiation Oncology

## 2022-09-17 ENCOUNTER — Other Ambulatory Visit: Payer: Self-pay

## 2022-09-17 DIAGNOSIS — Z510 Encounter for antineoplastic radiation therapy: Secondary | ICD-10-CM | POA: Diagnosis not present

## 2022-09-17 LAB — RAD ONC ARIA SESSION SUMMARY
Course Elapsed Days: 14
Plan Fractions Treated to Date: 11
Plan Prescribed Dose Per Fraction: 1.8 Gy
Plan Total Fractions Prescribed: 25
Plan Total Prescribed Dose: 45 Gy
Reference Point Dosage Given to Date: 19.8 Gy
Reference Point Session Dosage Given: 1.8 Gy
Session Number: 11

## 2022-09-18 ENCOUNTER — Other Ambulatory Visit: Payer: Self-pay

## 2022-09-18 ENCOUNTER — Ambulatory Visit
Admission: RE | Admit: 2022-09-18 | Discharge: 2022-09-18 | Disposition: A | Discharge status: Home / Self Care | Payer: Medicare PPO | Source: Ambulatory Visit | Attending: Radiation Oncology | Admitting: Radiation Oncology

## 2022-09-18 DIAGNOSIS — Z510 Encounter for antineoplastic radiation therapy: Secondary | ICD-10-CM | POA: Diagnosis not present

## 2022-09-18 LAB — RAD ONC ARIA SESSION SUMMARY
Course Elapsed Days: 15
Plan Fractions Treated to Date: 12
Plan Prescribed Dose Per Fraction: 1.8 Gy
Plan Total Fractions Prescribed: 25
Plan Total Prescribed Dose: 45 Gy
Reference Point Dosage Given to Date: 21.6 Gy
Reference Point Session Dosage Given: 1.8 Gy
Session Number: 12

## 2022-09-19 ENCOUNTER — Other Ambulatory Visit: Payer: Self-pay

## 2022-09-19 ENCOUNTER — Ambulatory Visit
Admission: RE | Admit: 2022-09-19 | Discharge: 2022-09-19 | Disposition: A | Discharge status: Home / Self Care | Payer: Medicare PPO | Source: Ambulatory Visit | Attending: Radiation Oncology | Admitting: Radiation Oncology

## 2022-09-19 DIAGNOSIS — Z510 Encounter for antineoplastic radiation therapy: Secondary | ICD-10-CM | POA: Diagnosis not present

## 2022-09-19 LAB — RAD ONC ARIA SESSION SUMMARY
Course Elapsed Days: 16
Plan Fractions Treated to Date: 13
Plan Prescribed Dose Per Fraction: 1.8 Gy
Plan Total Fractions Prescribed: 25
Plan Total Prescribed Dose: 45 Gy
Reference Point Dosage Given to Date: 23.4 Gy
Reference Point Session Dosage Given: 1.8 Gy
Session Number: 13

## 2022-09-20 ENCOUNTER — Other Ambulatory Visit: Payer: Self-pay | Admitting: Radiation Oncology

## 2022-09-20 ENCOUNTER — Other Ambulatory Visit: Payer: Self-pay

## 2022-09-20 ENCOUNTER — Encounter: Payer: Self-pay | Admitting: Radiation Oncology

## 2022-09-20 ENCOUNTER — Ambulatory Visit
Admission: RE | Admit: 2022-09-20 | Discharge: 2022-09-20 | Disposition: A | Discharge status: Home / Self Care | Payer: Medicare PPO | Source: Ambulatory Visit | Attending: Radiation Oncology | Admitting: Radiation Oncology

## 2022-09-20 DIAGNOSIS — Z510 Encounter for antineoplastic radiation therapy: Secondary | ICD-10-CM | POA: Diagnosis not present

## 2022-09-20 LAB — RAD ONC ARIA SESSION SUMMARY
Course Elapsed Days: 17
Plan Fractions Treated to Date: 14
Plan Prescribed Dose Per Fraction: 1.8 Gy
Plan Total Fractions Prescribed: 25
Plan Total Prescribed Dose: 45 Gy
Reference Point Dosage Given to Date: 25.2 Gy
Reference Point Session Dosage Given: 1.8 Gy
Session Number: 14

## 2022-09-22 ENCOUNTER — Ambulatory Visit
Admission: RE | Admit: 2022-09-22 | Discharge: 2022-09-22 | Disposition: A | Discharge status: Home / Self Care | Payer: Medicare PPO | Source: Ambulatory Visit | Attending: Radiation Oncology | Admitting: Radiation Oncology

## 2022-09-22 ENCOUNTER — Other Ambulatory Visit: Payer: Self-pay

## 2022-09-22 DIAGNOSIS — Z510 Encounter for antineoplastic radiation therapy: Secondary | ICD-10-CM | POA: Diagnosis not present

## 2022-09-22 LAB — RAD ONC ARIA SESSION SUMMARY
Course Elapsed Days: 19
Plan Fractions Treated to Date: 15
Plan Prescribed Dose Per Fraction: 1.8 Gy
Plan Total Fractions Prescribed: 25
Plan Total Prescribed Dose: 45 Gy
Reference Point Dosage Given to Date: 27 Gy
Reference Point Session Dosage Given: 1.8 Gy
Session Number: 15

## 2022-09-23 ENCOUNTER — Other Ambulatory Visit: Payer: Self-pay

## 2022-09-23 ENCOUNTER — Ambulatory Visit
Admission: RE | Admit: 2022-09-23 | Discharge: 2022-09-23 | Disposition: A | Discharge status: Home / Self Care | Payer: Medicare PPO | Source: Ambulatory Visit | Attending: Radiation Oncology | Admitting: Radiation Oncology

## 2022-09-23 DIAGNOSIS — Z510 Encounter for antineoplastic radiation therapy: Secondary | ICD-10-CM | POA: Diagnosis not present

## 2022-09-23 LAB — RAD ONC ARIA SESSION SUMMARY
Course Elapsed Days: 20
Plan Fractions Treated to Date: 16
Plan Prescribed Dose Per Fraction: 1.8 Gy
Plan Total Fractions Prescribed: 25
Plan Total Prescribed Dose: 45 Gy
Reference Point Dosage Given to Date: 28.8 Gy
Reference Point Session Dosage Given: 1.8 Gy
Session Number: 16

## 2022-09-24 ENCOUNTER — Other Ambulatory Visit: Payer: Self-pay

## 2022-09-24 ENCOUNTER — Ambulatory Visit
Admission: RE | Admit: 2022-09-24 | Discharge: 2022-09-24 | Disposition: A | Discharge status: Home / Self Care | Payer: Medicare PPO | Source: Ambulatory Visit | Attending: Radiation Oncology | Admitting: Radiation Oncology

## 2022-09-24 DIAGNOSIS — Z510 Encounter for antineoplastic radiation therapy: Secondary | ICD-10-CM | POA: Diagnosis not present

## 2022-09-24 LAB — RAD ONC ARIA SESSION SUMMARY
Course Elapsed Days: 21
Plan Fractions Treated to Date: 17
Plan Prescribed Dose Per Fraction: 1.8 Gy
Plan Total Fractions Prescribed: 25
Plan Total Prescribed Dose: 45 Gy
Reference Point Dosage Given to Date: 30.6 Gy
Reference Point Session Dosage Given: 1.8 Gy
Session Number: 17

## 2022-09-25 ENCOUNTER — Ambulatory Visit: Payer: Medicare PPO

## 2022-09-25 ENCOUNTER — Ambulatory Visit
Admission: RE | Admit: 2022-09-25 | Discharge: 2022-09-25 | Disposition: A | Discharge status: Home / Self Care | Payer: Medicare PPO | Source: Ambulatory Visit | Attending: Radiation Oncology | Admitting: Radiation Oncology

## 2022-09-25 ENCOUNTER — Other Ambulatory Visit: Payer: Self-pay

## 2022-09-25 DIAGNOSIS — Z510 Encounter for antineoplastic radiation therapy: Secondary | ICD-10-CM | POA: Diagnosis not present

## 2022-09-25 LAB — RAD ONC ARIA SESSION SUMMARY
Course Elapsed Days: 22
Plan Fractions Treated to Date: 18
Plan Prescribed Dose Per Fraction: 1.8 Gy
Plan Total Fractions Prescribed: 25
Plan Total Prescribed Dose: 45 Gy
Reference Point Dosage Given to Date: 32.4 Gy
Reference Point Session Dosage Given: 1.8 Gy
Session Number: 18

## 2022-09-30 ENCOUNTER — Other Ambulatory Visit: Payer: Self-pay

## 2022-09-30 ENCOUNTER — Ambulatory Visit
Admission: RE | Admit: 2022-09-30 | Discharge: 2022-09-30 | Disposition: A | Discharge status: Home / Self Care | Payer: Medicare PPO | Source: Ambulatory Visit | Attending: Radiation Oncology | Admitting: Radiation Oncology

## 2022-09-30 ENCOUNTER — Telehealth: Payer: Self-pay | Admitting: *Deleted

## 2022-09-30 DIAGNOSIS — Z510 Encounter for antineoplastic radiation therapy: Secondary | ICD-10-CM | POA: Diagnosis not present

## 2022-09-30 LAB — RAD ONC ARIA SESSION SUMMARY
Course Elapsed Days: 27
Plan Fractions Treated to Date: 19
Plan Prescribed Dose Per Fraction: 1.8 Gy
Plan Total Fractions Prescribed: 25
Plan Total Prescribed Dose: 45 Gy
Reference Point Dosage Given to Date: 34.2 Gy
Reference Point Session Dosage Given: 1.8 Gy
Session Number: 19

## 2022-09-30 NOTE — Telephone Encounter (Signed)
Patient to have consultation with Dr. Alyson Ingles on January 08, 2023 @ 11 am

## 2022-10-01 ENCOUNTER — Other Ambulatory Visit: Payer: Self-pay

## 2022-10-01 ENCOUNTER — Ambulatory Visit
Admission: RE | Admit: 2022-10-01 | Discharge: 2022-10-01 | Disposition: A | Discharge status: Home / Self Care | Payer: Medicare PPO | Source: Ambulatory Visit | Attending: Radiation Oncology | Admitting: Radiation Oncology

## 2022-10-01 DIAGNOSIS — Z510 Encounter for antineoplastic radiation therapy: Secondary | ICD-10-CM | POA: Diagnosis not present

## 2022-10-01 LAB — RAD ONC ARIA SESSION SUMMARY
Course Elapsed Days: 28
Plan Fractions Treated to Date: 20
Plan Prescribed Dose Per Fraction: 1.8 Gy
Plan Total Fractions Prescribed: 25
Plan Total Prescribed Dose: 45 Gy
Reference Point Dosage Given to Date: 36 Gy
Reference Point Session Dosage Given: 1.8 Gy
Session Number: 20

## 2022-10-02 ENCOUNTER — Ambulatory Visit
Admission: RE | Admit: 2022-10-02 | Discharge: 2022-10-02 | Disposition: A | Discharge status: Home / Self Care | Payer: Medicare PPO | Source: Ambulatory Visit | Attending: Radiation Oncology | Admitting: Radiation Oncology

## 2022-10-02 ENCOUNTER — Other Ambulatory Visit: Payer: Self-pay

## 2022-10-02 DIAGNOSIS — Z510 Encounter for antineoplastic radiation therapy: Secondary | ICD-10-CM | POA: Diagnosis not present

## 2022-10-02 LAB — RAD ONC ARIA SESSION SUMMARY
Course Elapsed Days: 29
Plan Fractions Treated to Date: 21
Plan Prescribed Dose Per Fraction: 1.8 Gy
Plan Total Fractions Prescribed: 25
Plan Total Prescribed Dose: 45 Gy
Reference Point Dosage Given to Date: 37.8 Gy
Reference Point Session Dosage Given: 1.8 Gy
Session Number: 21

## 2022-10-03 ENCOUNTER — Ambulatory Visit
Admission: RE | Admit: 2022-10-03 | Discharge: 2022-10-03 | Disposition: A | Discharge status: Home / Self Care | Payer: Medicare PPO | Source: Ambulatory Visit | Attending: Radiation Oncology | Admitting: Radiation Oncology

## 2022-10-03 ENCOUNTER — Other Ambulatory Visit: Payer: Self-pay

## 2022-10-03 DIAGNOSIS — Z510 Encounter for antineoplastic radiation therapy: Secondary | ICD-10-CM | POA: Diagnosis not present

## 2022-10-03 LAB — RAD ONC ARIA SESSION SUMMARY
Course Elapsed Days: 30
Plan Fractions Treated to Date: 22
Plan Prescribed Dose Per Fraction: 1.8 Gy
Plan Total Fractions Prescribed: 25
Plan Total Prescribed Dose: 45 Gy
Reference Point Dosage Given to Date: 39.6 Gy
Reference Point Session Dosage Given: 1.8 Gy
Session Number: 22

## 2022-10-04 ENCOUNTER — Ambulatory Visit
Admission: RE | Admit: 2022-10-04 | Discharge: 2022-10-04 | Disposition: A | Discharge status: Home / Self Care | Payer: Medicare PPO | Source: Ambulatory Visit | Attending: Radiation Oncology | Admitting: Radiation Oncology

## 2022-10-04 ENCOUNTER — Other Ambulatory Visit: Payer: Self-pay

## 2022-10-04 DIAGNOSIS — C61 Malignant neoplasm of prostate: Secondary | ICD-10-CM | POA: Insufficient documentation

## 2022-10-04 DIAGNOSIS — Z510 Encounter for antineoplastic radiation therapy: Secondary | ICD-10-CM | POA: Diagnosis not present

## 2022-10-04 LAB — RAD ONC ARIA SESSION SUMMARY
Course Elapsed Days: 31
Plan Fractions Treated to Date: 23
Plan Prescribed Dose Per Fraction: 1.8 Gy
Plan Total Fractions Prescribed: 25
Plan Total Prescribed Dose: 45 Gy
Reference Point Dosage Given to Date: 41.4 Gy
Reference Point Session Dosage Given: 1.8 Gy
Session Number: 23

## 2022-10-07 ENCOUNTER — Other Ambulatory Visit: Payer: Self-pay

## 2022-10-07 ENCOUNTER — Ambulatory Visit
Admission: RE | Admit: 2022-10-07 | Discharge: 2022-10-07 | Disposition: A | Discharge status: Home / Self Care | Payer: Medicare PPO | Source: Ambulatory Visit | Attending: Radiation Oncology | Admitting: Radiation Oncology

## 2022-10-07 DIAGNOSIS — Z510 Encounter for antineoplastic radiation therapy: Secondary | ICD-10-CM | POA: Diagnosis not present

## 2022-10-07 LAB — RAD ONC ARIA SESSION SUMMARY
Course Elapsed Days: 34
Plan Fractions Treated to Date: 24
Plan Prescribed Dose Per Fraction: 1.8 Gy
Plan Total Fractions Prescribed: 25
Plan Total Prescribed Dose: 45 Gy
Reference Point Dosage Given to Date: 43.2 Gy
Reference Point Session Dosage Given: 1.8 Gy
Session Number: 24

## 2022-10-08 ENCOUNTER — Other Ambulatory Visit: Payer: Self-pay

## 2022-10-08 ENCOUNTER — Ambulatory Visit
Admission: RE | Admit: 2022-10-08 | Discharge: 2022-10-08 | Disposition: A | Discharge status: Home / Self Care | Payer: Medicare PPO | Source: Ambulatory Visit | Attending: Radiation Oncology | Admitting: Radiation Oncology

## 2022-10-08 DIAGNOSIS — Z510 Encounter for antineoplastic radiation therapy: Secondary | ICD-10-CM | POA: Diagnosis not present

## 2022-10-08 LAB — RAD ONC ARIA SESSION SUMMARY
Course Elapsed Days: 35
Plan Fractions Treated to Date: 25
Plan Prescribed Dose Per Fraction: 1.8 Gy
Plan Total Fractions Prescribed: 25
Plan Total Prescribed Dose: 45 Gy
Reference Point Dosage Given to Date: 45 Gy
Reference Point Session Dosage Given: 1.8 Gy
Session Number: 25

## 2022-10-09 ENCOUNTER — Ambulatory Visit: Payer: Medicare PPO

## 2022-10-09 ENCOUNTER — Ambulatory Visit
Admission: RE | Admit: 2022-10-09 | Discharge: 2022-10-09 | Disposition: A | Discharge status: Home / Self Care | Payer: Medicare PPO | Source: Ambulatory Visit | Attending: Radiation Oncology | Admitting: Radiation Oncology

## 2022-10-09 ENCOUNTER — Other Ambulatory Visit: Payer: Self-pay

## 2022-10-09 DIAGNOSIS — Z510 Encounter for antineoplastic radiation therapy: Secondary | ICD-10-CM | POA: Diagnosis not present

## 2022-10-09 LAB — RAD ONC ARIA SESSION SUMMARY
Course Elapsed Days: 36
Plan Fractions Treated to Date: 1
Plan Prescribed Dose Per Fraction: 2 Gy
Plan Total Fractions Prescribed: 15
Plan Total Prescribed Dose: 30 Gy
Reference Point Dosage Given to Date: 2 Gy
Reference Point Session Dosage Given: 2 Gy
Session Number: 26

## 2022-10-10 ENCOUNTER — Other Ambulatory Visit: Payer: Self-pay

## 2022-10-10 ENCOUNTER — Ambulatory Visit: Payer: Medicare PPO

## 2022-10-10 ENCOUNTER — Ambulatory Visit
Admission: RE | Admit: 2022-10-10 | Discharge: 2022-10-10 | Disposition: A | Discharge status: Home / Self Care | Payer: Medicare PPO | Source: Ambulatory Visit | Attending: Radiation Oncology | Admitting: Radiation Oncology

## 2022-10-10 DIAGNOSIS — Z510 Encounter for antineoplastic radiation therapy: Secondary | ICD-10-CM | POA: Diagnosis not present

## 2022-10-10 LAB — RAD ONC ARIA SESSION SUMMARY
Course Elapsed Days: 37
Plan Fractions Treated to Date: 2
Plan Prescribed Dose Per Fraction: 2 Gy
Plan Total Fractions Prescribed: 15
Plan Total Prescribed Dose: 30 Gy
Reference Point Dosage Given to Date: 4 Gy
Reference Point Session Dosage Given: 2 Gy
Session Number: 27

## 2022-10-11 ENCOUNTER — Other Ambulatory Visit: Payer: Self-pay

## 2022-10-11 ENCOUNTER — Ambulatory Visit
Admission: RE | Admit: 2022-10-11 | Discharge: 2022-10-11 | Disposition: A | Discharge status: Home / Self Care | Payer: Medicare PPO | Source: Ambulatory Visit | Attending: Radiation Oncology | Admitting: Radiation Oncology

## 2022-10-11 DIAGNOSIS — Z510 Encounter for antineoplastic radiation therapy: Secondary | ICD-10-CM | POA: Diagnosis not present

## 2022-10-11 LAB — RAD ONC ARIA SESSION SUMMARY
Course Elapsed Days: 38
Plan Fractions Treated to Date: 3
Plan Prescribed Dose Per Fraction: 2 Gy
Plan Total Fractions Prescribed: 15
Plan Total Prescribed Dose: 30 Gy
Reference Point Dosage Given to Date: 6 Gy
Reference Point Session Dosage Given: 2 Gy
Session Number: 28

## 2022-10-14 ENCOUNTER — Ambulatory Visit
Admission: RE | Admit: 2022-10-14 | Discharge: 2022-10-14 | Disposition: A | Discharge status: Home / Self Care | Payer: Medicare PPO | Source: Ambulatory Visit | Attending: Radiation Oncology | Admitting: Radiation Oncology

## 2022-10-14 ENCOUNTER — Other Ambulatory Visit: Payer: Self-pay

## 2022-10-14 DIAGNOSIS — Z510 Encounter for antineoplastic radiation therapy: Secondary | ICD-10-CM | POA: Diagnosis not present

## 2022-10-14 LAB — RAD ONC ARIA SESSION SUMMARY
Course Elapsed Days: 41
Plan Fractions Treated to Date: 4
Plan Prescribed Dose Per Fraction: 2 Gy
Plan Total Fractions Prescribed: 15
Plan Total Prescribed Dose: 30 Gy
Reference Point Dosage Given to Date: 8 Gy
Reference Point Session Dosage Given: 2 Gy
Session Number: 29

## 2022-10-15 ENCOUNTER — Other Ambulatory Visit: Payer: Self-pay

## 2022-10-15 ENCOUNTER — Ambulatory Visit
Admission: RE | Admit: 2022-10-15 | Discharge: 2022-10-15 | Disposition: A | Discharge status: Home / Self Care | Payer: Medicare PPO | Source: Ambulatory Visit | Attending: Radiation Oncology | Admitting: Radiation Oncology

## 2022-10-15 DIAGNOSIS — Z510 Encounter for antineoplastic radiation therapy: Secondary | ICD-10-CM | POA: Diagnosis not present

## 2022-10-15 LAB — RAD ONC ARIA SESSION SUMMARY
Course Elapsed Days: 42
Plan Fractions Treated to Date: 5
Plan Prescribed Dose Per Fraction: 2 Gy
Plan Total Fractions Prescribed: 15
Plan Total Prescribed Dose: 30 Gy
Reference Point Dosage Given to Date: 10 Gy
Reference Point Session Dosage Given: 2 Gy
Session Number: 30

## 2022-10-16 ENCOUNTER — Ambulatory Visit
Admission: RE | Admit: 2022-10-16 | Discharge: 2022-10-16 | Disposition: A | Discharge status: Home / Self Care | Payer: Medicare PPO | Source: Ambulatory Visit | Attending: Radiation Oncology | Admitting: Radiation Oncology

## 2022-10-16 ENCOUNTER — Other Ambulatory Visit: Payer: Self-pay

## 2022-10-16 DIAGNOSIS — Z510 Encounter for antineoplastic radiation therapy: Secondary | ICD-10-CM | POA: Diagnosis not present

## 2022-10-16 LAB — RAD ONC ARIA SESSION SUMMARY
Course Elapsed Days: 43
Plan Fractions Treated to Date: 6
Plan Prescribed Dose Per Fraction: 2 Gy
Plan Total Fractions Prescribed: 15
Plan Total Prescribed Dose: 30 Gy
Reference Point Dosage Given to Date: 12 Gy
Reference Point Session Dosage Given: 2 Gy
Session Number: 31

## 2022-10-17 ENCOUNTER — Ambulatory Visit
Admission: RE | Admit: 2022-10-17 | Discharge: 2022-10-17 | Disposition: A | Discharge status: Home / Self Care | Payer: Medicare PPO | Source: Ambulatory Visit | Attending: Radiation Oncology | Admitting: Radiation Oncology

## 2022-10-17 ENCOUNTER — Other Ambulatory Visit: Payer: Self-pay

## 2022-10-17 DIAGNOSIS — Z510 Encounter for antineoplastic radiation therapy: Secondary | ICD-10-CM | POA: Diagnosis not present

## 2022-10-17 LAB — RAD ONC ARIA SESSION SUMMARY
Course Elapsed Days: 44
Plan Fractions Treated to Date: 7
Plan Prescribed Dose Per Fraction: 2 Gy
Plan Total Fractions Prescribed: 15
Plan Total Prescribed Dose: 30 Gy
Reference Point Dosage Given to Date: 14 Gy
Reference Point Session Dosage Given: 2 Gy
Session Number: 32

## 2022-10-18 ENCOUNTER — Other Ambulatory Visit: Payer: Self-pay

## 2022-10-18 ENCOUNTER — Ambulatory Visit
Admission: RE | Admit: 2022-10-18 | Discharge: 2022-10-18 | Disposition: A | Discharge status: Home / Self Care | Payer: Medicare PPO | Source: Ambulatory Visit | Attending: Radiation Oncology | Admitting: Radiation Oncology

## 2022-10-18 DIAGNOSIS — Z510 Encounter for antineoplastic radiation therapy: Secondary | ICD-10-CM | POA: Diagnosis not present

## 2022-10-18 LAB — RAD ONC ARIA SESSION SUMMARY
Course Elapsed Days: 45
Plan Fractions Treated to Date: 8
Plan Prescribed Dose Per Fraction: 2 Gy
Plan Total Fractions Prescribed: 15
Plan Total Prescribed Dose: 30 Gy
Reference Point Dosage Given to Date: 16 Gy
Reference Point Session Dosage Given: 2 Gy
Session Number: 33

## 2022-10-21 ENCOUNTER — Other Ambulatory Visit: Payer: Self-pay

## 2022-10-21 ENCOUNTER — Ambulatory Visit
Admission: RE | Admit: 2022-10-21 | Discharge: 2022-10-21 | Disposition: A | Discharge status: Home / Self Care | Payer: Medicare PPO | Source: Ambulatory Visit | Attending: Radiation Oncology | Admitting: Radiation Oncology

## 2022-10-21 DIAGNOSIS — Z510 Encounter for antineoplastic radiation therapy: Secondary | ICD-10-CM | POA: Diagnosis not present

## 2022-10-21 LAB — RAD ONC ARIA SESSION SUMMARY
Course Elapsed Days: 48
Plan Fractions Treated to Date: 9
Plan Prescribed Dose Per Fraction: 2 Gy
Plan Total Fractions Prescribed: 15
Plan Total Prescribed Dose: 30 Gy
Reference Point Dosage Given to Date: 18 Gy
Reference Point Session Dosage Given: 2 Gy
Session Number: 34

## 2022-10-22 ENCOUNTER — Ambulatory Visit
Admission: RE | Admit: 2022-10-22 | Discharge: 2022-10-22 | Disposition: A | Discharge status: Home / Self Care | Payer: Medicare PPO | Source: Ambulatory Visit | Attending: Radiation Oncology | Admitting: Radiation Oncology

## 2022-10-22 ENCOUNTER — Other Ambulatory Visit: Payer: Self-pay

## 2022-10-22 DIAGNOSIS — Z510 Encounter for antineoplastic radiation therapy: Secondary | ICD-10-CM | POA: Diagnosis not present

## 2022-10-22 LAB — RAD ONC ARIA SESSION SUMMARY
Course Elapsed Days: 49
Plan Fractions Treated to Date: 10
Plan Prescribed Dose Per Fraction: 2 Gy
Plan Total Fractions Prescribed: 15
Plan Total Prescribed Dose: 30 Gy
Reference Point Dosage Given to Date: 20 Gy
Reference Point Session Dosage Given: 2 Gy
Session Number: 35

## 2022-10-23 ENCOUNTER — Other Ambulatory Visit: Payer: Self-pay

## 2022-10-23 ENCOUNTER — Ambulatory Visit
Admission: RE | Admit: 2022-10-23 | Discharge: 2022-10-23 | Disposition: A | Discharge status: Home / Self Care | Payer: Medicare PPO | Source: Ambulatory Visit | Attending: Radiation Oncology | Admitting: Radiation Oncology

## 2022-10-23 DIAGNOSIS — Z510 Encounter for antineoplastic radiation therapy: Secondary | ICD-10-CM | POA: Diagnosis not present

## 2022-10-23 LAB — RAD ONC ARIA SESSION SUMMARY
Course Elapsed Days: 50
Plan Fractions Treated to Date: 11
Plan Prescribed Dose Per Fraction: 2 Gy
Plan Total Fractions Prescribed: 15
Plan Total Prescribed Dose: 30 Gy
Reference Point Dosage Given to Date: 22 Gy
Reference Point Session Dosage Given: 2 Gy
Session Number: 36

## 2022-10-24 ENCOUNTER — Other Ambulatory Visit: Payer: Self-pay

## 2022-10-24 ENCOUNTER — Ambulatory Visit
Admission: RE | Admit: 2022-10-24 | Discharge: 2022-10-24 | Disposition: A | Discharge status: Home / Self Care | Payer: Medicare PPO | Source: Ambulatory Visit | Attending: Radiation Oncology | Admitting: Radiation Oncology

## 2022-10-24 DIAGNOSIS — Z510 Encounter for antineoplastic radiation therapy: Secondary | ICD-10-CM | POA: Diagnosis not present

## 2022-10-24 LAB — RAD ONC ARIA SESSION SUMMARY
Course Elapsed Days: 51
Plan Fractions Treated to Date: 12
Plan Prescribed Dose Per Fraction: 2 Gy
Plan Total Fractions Prescribed: 15
Plan Total Prescribed Dose: 30 Gy
Reference Point Dosage Given to Date: 24 Gy
Reference Point Session Dosage Given: 2 Gy
Session Number: 37

## 2022-10-25 ENCOUNTER — Ambulatory Visit
Admission: RE | Admit: 2022-10-25 | Discharge: 2022-10-25 | Disposition: A | Discharge status: Home / Self Care | Payer: Medicare PPO | Source: Ambulatory Visit | Attending: Radiation Oncology | Admitting: Radiation Oncology

## 2022-10-25 ENCOUNTER — Other Ambulatory Visit: Payer: Self-pay

## 2022-10-25 DIAGNOSIS — Z510 Encounter for antineoplastic radiation therapy: Secondary | ICD-10-CM | POA: Diagnosis not present

## 2022-10-25 LAB — RAD ONC ARIA SESSION SUMMARY
Course Elapsed Days: 52
Plan Fractions Treated to Date: 13
Plan Prescribed Dose Per Fraction: 2 Gy
Plan Total Fractions Prescribed: 15
Plan Total Prescribed Dose: 30 Gy
Reference Point Dosage Given to Date: 26 Gy
Reference Point Session Dosage Given: 2 Gy
Session Number: 38

## 2022-10-28 ENCOUNTER — Ambulatory Visit: Payer: Medicare PPO

## 2022-10-29 ENCOUNTER — Other Ambulatory Visit: Payer: Self-pay

## 2022-10-29 ENCOUNTER — Ambulatory Visit
Admission: RE | Admit: 2022-10-29 | Discharge: 2022-10-29 | Disposition: A | Discharge status: Home / Self Care | Payer: Medicare PPO | Source: Ambulatory Visit | Attending: Radiation Oncology | Admitting: Radiation Oncology

## 2022-10-29 DIAGNOSIS — Z510 Encounter for antineoplastic radiation therapy: Secondary | ICD-10-CM | POA: Diagnosis not present

## 2022-10-29 LAB — RAD ONC ARIA SESSION SUMMARY
Course Elapsed Days: 56
Plan Fractions Treated to Date: 14
Plan Prescribed Dose Per Fraction: 2 Gy
Plan Total Fractions Prescribed: 15
Plan Total Prescribed Dose: 30 Gy
Reference Point Dosage Given to Date: 28 Gy
Reference Point Session Dosage Given: 2 Gy
Session Number: 39

## 2022-10-30 ENCOUNTER — Ambulatory Visit: Payer: Medicare PPO

## 2022-10-30 ENCOUNTER — Ambulatory Visit
Admission: RE | Admit: 2022-10-30 | Discharge: 2022-10-30 | Disposition: A | Discharge status: Home / Self Care | Payer: Medicare PPO | Source: Ambulatory Visit | Attending: Radiation Oncology | Admitting: Radiation Oncology

## 2022-10-30 ENCOUNTER — Other Ambulatory Visit: Payer: Self-pay

## 2022-10-30 ENCOUNTER — Encounter: Payer: Self-pay | Admitting: Urology

## 2022-10-30 DIAGNOSIS — Z510 Encounter for antineoplastic radiation therapy: Secondary | ICD-10-CM | POA: Diagnosis not present

## 2022-10-30 LAB — RAD ONC ARIA SESSION SUMMARY
Course Elapsed Days: 57
Plan Fractions Treated to Date: 15
Plan Prescribed Dose Per Fraction: 2 Gy
Plan Total Fractions Prescribed: 15
Plan Total Prescribed Dose: 30 Gy
Reference Point Dosage Given to Date: 30 Gy
Reference Point Session Dosage Given: 2 Gy
Session Number: 40

## 2022-10-31 ENCOUNTER — Ambulatory Visit: Payer: Medicare PPO

## 2022-11-08 ENCOUNTER — Other Ambulatory Visit: Payer: Self-pay | Admitting: Urology

## 2022-11-08 ENCOUNTER — Telehealth: Payer: Self-pay

## 2022-11-08 MED ORDER — TAMSULOSIN HCL 0.4 MG PO CAPS: 0.4000 mg | ORAL_CAPSULE | Freq: Two times a day (BID) | ORAL | 2 refills | Status: DC

## 2022-11-08 NOTE — Telephone Encounter (Signed)
RN left voicemail for Mr. Westbrook to pick up prescription of Tamsulosin at the pharmacy.

## 2022-11-21 NOTE — Progress Notes (Signed)
                                                                                                                                                             Patient Name: Walter Harrison MRN: [id number] DOB: 1951/10/24 Referring Physician: Leana Gamer Date of Service: 10/30/2022 [facility], [place]                                                        End Of Treatment Note  Diagnoses: C61-Malignant neoplasm of prostate  Cancer Staging: 72 y.o. 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   Intent: Curative  Radiation Treatment Dates: 09/03/2022 through 10/30/2022 Site Technique Total Dose (Gy) Dose per Fx (Gy) Completed Fx Beam Energies  Prostate: Prostate_Pelvis IMRT 45/45 1.8 25/25 6X  Prostate: Prostate_Bst_IMRT IMRT 30/30 2 15/15 6X   Narrative: The patient tolerated radiation therapy relatively well.  He did report increased nocturia x 4 per night and dysuria despite taking Flomax daily as prescribed.  His LUTS did improve with increasing his Flomax dose to twice daily.  He also reported some diarrhea that was manageable without any medication.  Plan: The patient will receive a call in about one month from the radiation oncology department. He will continue follow up with his urologist, Dr. Yves Dill as well.  ------------------------------------------------   Tyler Pita, MD [place]: [phone]  Fax: [phone] [email or web address]  Skype  LinkedIn

## 2022-11-26 NOTE — Progress Notes (Signed)
RN left message for call back to review follow up with urology.

## 2022-12-09 NOTE — Progress Notes (Signed)
  Radiation Oncology         [phone] ________________________________  Name: Walter Harrison MRN: [id number]  Date of Service: 12/10/2022  DOB: May 02, 1951  Post Treatment Telephone Note  Diagnosis:  72 y.o. 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   Intent: Curative  Radiation Treatment Dates: 09/03/2022 through 10/30/2022 Site Technique Total Dose (Gy) Dose per Fx (Gy) Completed Fx Beam Energies  Prostate: Prostate_Pelvis IMRT 45/45 1.8 25/25 6X  Prostate: Prostate_Bst_IMRT IMRT 30/30 2 15/15 6X    (as documented in provider EOT note)   Pre Treatment IPSS Score: 13 (as documented in the provider consult note)   The patient was not available for call today. A detailed voicemail was left.   Patient does not currently have any scheduled follow up with his urologist to his knowledge so he was advised to call [facility] to schedule his post-treatment follow up with Dr. Yves Dill for ongoing surveillance. He was counseled that PSA levels will be drawn in the urology office, and was reassured that additional time is expected to improve bowel and bladder symptoms. He was encouraged to call back with concerns or questions regarding radiation.  This concludes the interview.   Leandra Kern, LPN

## 2022-12-10 ENCOUNTER — Ambulatory Visit
Admission: RE | Admit: 2022-12-10 | Discharge: 2022-12-10 | Disposition: A | Discharge status: Home / Self Care | Payer: Medicare PPO | Source: Ambulatory Visit | Attending: Radiation Oncology | Admitting: Radiation Oncology

## 2022-12-10 DIAGNOSIS — Z510 Encounter for antineoplastic radiation therapy: Secondary | ICD-10-CM | POA: Insufficient documentation

## 2022-12-10 DIAGNOSIS — C61 Malignant neoplasm of prostate: Secondary | ICD-10-CM | POA: Insufficient documentation

## 2023-01-08 ENCOUNTER — Ambulatory Visit: Payer: Medicare PPO | Admitting: Urology

## 2023-01-10 ENCOUNTER — Other Ambulatory Visit: Payer: Medicare PPO

## 2023-01-14 ENCOUNTER — Ambulatory Visit: Payer: Medicare PPO | Admitting: Urology

## 2023-01-21 ENCOUNTER — Encounter: Payer: Self-pay | Admitting: Urology

## 2023-01-21 ENCOUNTER — Ambulatory Visit: Payer: Medicare PPO | Admitting: Urology

## 2023-01-21 VITALS — BP 112/71 | HR 69

## 2023-01-21 DIAGNOSIS — N401 Enlarged prostate with lower urinary tract symptoms: Secondary | ICD-10-CM | POA: Diagnosis not present

## 2023-01-21 DIAGNOSIS — N138 Other obstructive and reflux uropathy: Secondary | ICD-10-CM

## 2023-01-21 DIAGNOSIS — C61 Malignant neoplasm of prostate: Secondary | ICD-10-CM | POA: Diagnosis not present

## 2023-01-21 DIAGNOSIS — R351 Nocturia: Secondary | ICD-10-CM | POA: Diagnosis not present

## 2023-01-21 DIAGNOSIS — R972 Elevated prostate specific antigen [PSA]: Secondary | ICD-10-CM

## 2023-01-21 LAB — URINALYSIS, ROUTINE W REFLEX MICROSCOPIC
Bilirubin, UA: NEGATIVE
Glucose, UA: NEGATIVE
Ketones, UA: NEGATIVE
Leukocytes,UA: NEGATIVE
Nitrite, UA: NEGATIVE
Protein,UA: NEGATIVE
RBC, UA: NEGATIVE
Specific Gravity, UA: 1.025 (ref 1.005–1.030)
Urobilinogen, Ur: 0.2 mg/dL (ref 0.2–1.0)
pH, UA: 5.5 (ref 5.0–7.5)

## 2023-01-21 MED ORDER — TAMSULOSIN HCL 0.4 MG PO CAPS: 0.4000 mg | ORAL_CAPSULE | Freq: Two times a day (BID) | ORAL | 3 refills | Status: DC

## 2023-01-21 NOTE — Patient Instructions (Signed)

## 2023-01-21 NOTE — Progress Notes (Unsigned)
01/21/2023 2:18 PM   Walter Harrison May 13, 1951 [id number]  Referring provider: [facility] [place],  [place] [address]  Prodstate cancer and BPH  HPI: Walter Harrison is a 72yo here for evaluation of prostate cancer. He was diagnosed with high risk prostate cancer in 06/2022 with Walter Harrison and started on ADT. He is on orgovyx. Finaihsed IMRT dec 2023. His original Gleason score was Gleason 4+4=8. IPSS 14 QOL 3 on flomax 0.4mg  BID. He has moderate hot flashes. He has decreased libido and difficulty getting an erection. No bone pain. No recent PSA    PMH: Past Medical History:  Diagnosis Date   BPH (benign prostatic hyperplasia)    Cancer (HCC)    Hay fever    Hypertension    Pneumonia    Thalassemia 06/04/2012    Surgical History: Past Surgical History:  Procedure Laterality Date   COLONOSCOPY  2009   Walter Harrison: diverticulosis but h/o colon polyps on prior   COLONOSCOPY N/A 04/07/2014   Procedure: COLONOSCOPY;  Surgeon: Walter Dolin, MD;  [facility];  Service: Endoscopy;  Laterality: N/A;  1:45   COLONOSCOPY N/A 07/21/2019   Procedure: COLONOSCOPY;  Surgeon: Walter Dolin, MD;  [facility];  Service: Endoscopy;  Laterality: N/A;  8:30   ESOPHAGOGASTRODUODENOSCOPY     multiple times by Walter Harrison, 7-8 times. yearly for the hiatal hernia. no barrett's esophagus per patient.  Walter Harrison   ESOPHAGOGASTRODUODENOSCOPY  03/2012   Walter Harrison: [facility], 7cm paraesophageal hernia, gastritis   EXTRACORPOREAL SHOCK WAVE LITHOTRIPSY Left 07/18/2022   Procedure: EXTRACORPOREAL SHOCK WAVE LITHOTRIPSY (ESWL);  Surgeon: Walter Cowper, MD;  Location: [facility];  Service: Urology;  Laterality: Left;   HERNIA REPAIR     hiatal hernia repair, [facility]   TONSILLECTOMY     TRANSURETHRAL RESECTION OF PROSTATE      Home Medications:  Allergies as of 01/21/2023       Reactions   Sulfa Antibiotics Nausea Only        Medication List        Accurate  as of January 21, 2023  2:18 PM. If you have any questions, ask your nurse or doctor.          acetaminophen-codeine 300-30 MG tablet Commonly known as: TYLENOL #3 Take 1 tablet by mouth every 6 (six) hours as needed for moderate pain.   allopurinol 300 MG tablet Commonly known as: ZYLOPRIM Take 300 mg by mouth daily.   aspirin 81 MG chewable tablet Chew 81 mg by mouth daily after breakfast.   chlorthalidone 25 MG tablet Commonly known as: HYGROTON Take 12.5 mg by mouth daily after breakfast.   multivitamin with minerals Tabs tablet Take 1 tablet by mouth daily after breakfast. Centrum Silver   ORGOVYX PO Take 120 mg by mouth daily.   tamsulosin 0.4 MG Caps capsule Commonly known as: FLOMAX Take 1 capsule (0.4 mg total) by mouth 2 (two) times daily after a meal.        Allergies:  Allergies  Allergen Reactions   Sulfa Antibiotics Nausea Only    Family History: Family History  Problem Relation Age of Onset   Cancer - Prostate Father    Stroke Sister    Kidney disease Sister    Prostate cancer Brother        has had bladder cancer, now fighting four battle with cancer   Colon cancer Neg Hx  Social History:  reports that he has never smoked. He has quit using smokeless tobacco. He reports that he does not drink alcohol and does not use drugs.  ROS: All other review of systems were reviewed and are negative except what is noted above in HPI  Physical Exam: BP 112/71   Pulse 69   Constitutional:  Alert and oriented, No acute distress. HEENT: [place] AT, moist mucus membranes.  Trachea midline, no masses. Cardiovascular: No clubbing, cyanosis, or edema. Respiratory: Normal respiratory effort, no increased work of breathing. GI: Abdomen is soft, nontender, nondistended, no abdominal masses GU: No CVA tenderness.  Lymph: No cervical or inguinal lymphadenopathy. Skin: No rashes, bruises or suspicious lesions. Neurologic: Grossly intact, no focal deficits, moving  all 4 extremities. Psychiatric: Normal mood and affect.  Laboratory Data: Lab Results  Component Value Date   WBC 7.6 06/27/2022   HGB 15.0 06/27/2022   HCT 45.3 06/27/2022   MCV 89.3 06/27/2022   PLT 199 06/27/2022    Lab Results  Component Value Date   CREATININE 0.97 06/27/2022    No results found for: "PSA"  No results found for: "TESTOSTERONE"  No results found for: "HGBA1C"  Urinalysis No results found for: "COLORURINE", "APPEARANCEUR", "LABSPEC", "PHURINE", "GLUCOSEU", "HGBUR", "BILIRUBINUR", "KETONESUR", "PROTEINUR", "UROBILINOGEN", "NITRITE", "LEUKOCYTESUR"  No results found for: "LABMICR", "WBCUA", "RBCUA", "LABEPIT", "MUCUS", "BACTERIA"  Pertinent Imaging:  Results for orders placed during the hospital encounter of 07/18/22  DG Abd 1 View  Narrative CLINICAL DATA:  Kidney stone  EXAM: ABDOMEN - 1 VIEW  COMPARISON:  CT abdomen done on 06/05/2022  FINDINGS: Bowel gas pattern is nonspecific. There is a 6 mm calcific density in the lower pole of left kidney. Kidneys are partly obscured by bowel contents. No abnormal masses are seen. Phleboliths are seen in pelvis.  IMPRESSION: There is a 6 mm left renal calculus.   Electronically Signed By: Elmer Picker M.D. On: 07/18/2022 12:57  No results found for this or any previous visit.  No results found for this or any previous visit.  No results found for this or any previous visit.  No results found for this or any previous visit.  No valid procedures specified. No results found for this or any previous visit.  No results found for this or any previous visit.   Assessment & Plan:    1. Prostate cancer [facility]) -PSA today -followup 3 months with PSA -Continue orgovyx until August 2024  2. Benign prostatic hyperplasia with urinary obstruction -continue flomax BID  3. Nocturia -continue flomax BID   No follow-ups on file.  Walter Bang, MD  [facility] [place]

## 2023-01-22 LAB — PSA: Prostate Specific Ag, Serum: <0.1 ng/mL (ref 0.0–4.0)

## 2023-04-18 ENCOUNTER — Ambulatory Visit: Payer: Medicare PPO | Admitting: Urology

## 2023-04-18 VITALS — BP 108/67 | HR 63 | Ht 68.0 in | Wt 185.0 lb

## 2023-04-18 DIAGNOSIS — R351 Nocturia: Secondary | ICD-10-CM | POA: Diagnosis not present

## 2023-04-18 DIAGNOSIS — R972 Elevated prostate specific antigen [PSA]: Secondary | ICD-10-CM

## 2023-04-18 DIAGNOSIS — N138 Other obstructive and reflux uropathy: Secondary | ICD-10-CM

## 2023-04-18 DIAGNOSIS — C61 Malignant neoplasm of prostate: Secondary | ICD-10-CM | POA: Diagnosis not present

## 2023-04-18 DIAGNOSIS — N401 Enlarged prostate with lower urinary tract symptoms: Secondary | ICD-10-CM | POA: Diagnosis not present

## 2023-04-18 LAB — URINALYSIS, ROUTINE W REFLEX MICROSCOPIC
Bilirubin, UA: NEGATIVE
Glucose, UA: NEGATIVE
Ketones, UA: NEGATIVE
Leukocytes,UA: NEGATIVE
Nitrite, UA: NEGATIVE
Protein,UA: NEGATIVE
RBC, UA: NEGATIVE
Specific Gravity, UA: 1.02 (ref 1.005–1.030)
Urobilinogen, Ur: 0.2 mg/dL (ref 0.2–1.0)
pH, UA: 6 (ref 5.0–7.5)

## 2023-04-18 MED ORDER — TAMSULOSIN HCL 0.4 MG PO CAPS: 0.4000 mg | ORAL_CAPSULE | Freq: Two times a day (BID) | ORAL | 3 refills | Status: DC

## 2023-04-18 NOTE — Progress Notes (Signed)
04/18/2023 12:18 PM   Walter Harrison 09-19-1951 [id number]  Referring provider: [facility] [address] [place],  [place] [address]  Followup prostate cancer and BPH   HPI: Walter Harrison is a 71yo here for followup for prostate cancer and BPH. He is on orgovyx until August 2024. No recent PSA. IPSS 7 QOL 1 on flomax BID. Urine stream strong.  He has nocturia 3x which does not bother him. He has mild hot flashes. No other complaints today   PMH: Past Medical History:  Diagnosis Date   BPH (benign prostatic hyperplasia)    Cancer (HCC)    Hay fever    Hypertension    Pneumonia    Thalassemia 06/04/2012    Surgical History: Past Surgical History:  Procedure Laterality Date   COLONOSCOPY  2009   Dr. Allena Katz: diverticulosis but h/o colon polyps on prior   COLONOSCOPY N/A 04/07/2014   Procedure: COLONOSCOPY;  Surgeon: Corbin Ade, MD;  [facility];  Service: Endoscopy;  Laterality: N/A;  1:45   COLONOSCOPY N/A 07/21/2019   Procedure: COLONOSCOPY;  Surgeon: Corbin Ade, MD;  [facility];  Service: Endoscopy;  Laterality: N/A;  8:30   ESOPHAGOGASTRODUODENOSCOPY     multiple times by Dr. Allena Katz, 7-8 times. yearly for the hiatal hernia. no barrett's esophagus per patient.  Dr. Allena Katz   ESOPHAGOGASTRODUODENOSCOPY  03/2012   Dr. Allena Katz: [facility], 7cm paraesophageal hernia, gastritis   EXTRACORPOREAL SHOCK WAVE LITHOTRIPSY Left 07/18/2022   Procedure: EXTRACORPOREAL SHOCK WAVE LITHOTRIPSY (ESWL);  Surgeon: Orson Ape, MD;  Location: [facility];  Service: Urology;  Laterality: Left;   HERNIA REPAIR     hiatal hernia repair, [facility]   TONSILLECTOMY     TRANSURETHRAL RESECTION OF PROSTATE      Home Medications:  Allergies as of 04/18/2023       Reactions   Sulfa Antibiotics Nausea Only        Medication List        Accurate as of April 18, 2023 12:18 PM. If you have any questions, ask your nurse or doctor.          acetaminophen-codeine  300-30 MG tablet Commonly known as: TYLENOL #3 Take 1 tablet by mouth every 6 (six) hours as needed for moderate pain.   allopurinol 300 MG tablet Commonly known as: ZYLOPRIM Take 300 mg by mouth daily.   aspirin 81 MG chewable tablet Chew 81 mg by mouth daily after breakfast.   chlorthalidone 25 MG tablet Commonly known as: HYGROTON Take 12.5 mg by mouth daily after breakfast.   multivitamin with minerals Tabs tablet Take 1 tablet by mouth daily after breakfast. Centrum Silver   ORGOVYX PO Take 120 mg by mouth daily.   tamsulosin 0.4 MG Caps capsule Commonly known as: FLOMAX Take 1 capsule (0.4 mg total) by mouth 2 (two) times daily after a meal.        Allergies:  Allergies  Allergen Reactions   Sulfa Antibiotics Nausea Only    Family History: Family History  Problem Relation Age of Onset   Cancer - Prostate Father    Stroke Sister    Kidney disease Sister    Prostate cancer Brother        has had bladder cancer, now fighting four battle with cancer   Colon cancer Neg Hx     Social History:  reports that he has never smoked. He has quit using smokeless tobacco. He reports that he  does not drink alcohol and does not use drugs.  ROS: All other review of systems were reviewed and are negative except what is noted above in HPI  Physical Exam: BP 108/67   Pulse 63   Ht 5\' 8"  (1.727 m)   Wt 185 lb (83.9 kg)   BMI 28.13 kg/m   Constitutional:  Alert and oriented, No acute distress. HEENT: [place] AT, moist mucus membranes.  Trachea midline, no masses. Cardiovascular: No clubbing, cyanosis, or edema. Respiratory: Normal respiratory effort, no increased work of breathing. GI: Abdomen is soft, nontender, nondistended, no abdominal masses GU: No CVA tenderness.  Lymph: No cervical or inguinal lymphadenopathy. Skin: No rashes, bruises or suspicious lesions. Neurologic: Grossly intact, no focal deficits, moving all 4 extremities. Psychiatric: Normal mood and  affect.  Laboratory Data: Lab Results  Component Value Date   WBC 7.6 06/27/2022   HGB 15.0 06/27/2022   HCT 45.3 06/27/2022   MCV 89.3 06/27/2022   PLT 199 06/27/2022    Lab Results  Component Value Date   CREATININE 0.97 06/27/2022    No results found for: "PSA"  No results found for: "TESTOSTERONE"  No results found for: "HGBA1C"  Urinalysis    Component Value Date/Time   APPEARANCEUR Clear 01/21/2023 1424   GLUCOSEU Negative 01/21/2023 1424   BILIRUBINUR Negative 01/21/2023 1424   PROTEINUR Negative 01/21/2023 1424   NITRITE Negative 01/21/2023 1424   LEUKOCYTESUR Negative 01/21/2023 1424    Lab Results  Component Value Date   LABMICR Comment 01/21/2023    Pertinent Imaging:  Results for orders placed during the hospital encounter of 07/18/22  DG Abd 1 View  Narrative CLINICAL DATA:  Kidney stone  EXAM: ABDOMEN - 1 VIEW  COMPARISON:  CT abdomen done on 06/05/2022  FINDINGS: Bowel gas pattern is nonspecific. There is a 6 mm calcific density in the lower pole of left kidney. Kidneys are partly obscured by bowel contents. No abnormal masses are seen. Phleboliths are seen in pelvis.  IMPRESSION: There is a 6 mm left renal calculus.   Electronically Signed By: Ernie Avena M.D. On: 07/18/2022 12:57  No results found for this or any previous visit.  No results found for this or any previous visit.  No results found for this or any previous visit.  No results found for this or any previous visit.  No valid procedures specified. No results found for this or any previous visit.  No results found for this or any previous visit.   Assessment & Plan:    1.  Prostate cancer (HCC) -PSA today -followup 3 months with a PSA  2. Benign prostatic hyperplasia with urinary obstruction Flomax 0.4mg  BID  3. Nocturia Flomax 0.4mg  BID   No follow-ups on file.  Wilkie Aye, MD  [facility] [place]

## 2023-04-19 LAB — PSA: Prostate Specific Ag, Serum: <0.1 ng/mL (ref 0.0–4.0)

## 2023-04-25 ENCOUNTER — Encounter: Payer: Self-pay | Admitting: Urology

## 2023-04-25 NOTE — Patient Instructions (Signed)

## 2023-04-29 ENCOUNTER — Other Ambulatory Visit: Payer: Self-pay | Admitting: *Deleted

## 2023-04-29 ENCOUNTER — Ambulatory Visit: Payer: Medicare PPO | Admitting: Gastroenterology

## 2023-04-29 ENCOUNTER — Encounter: Payer: Self-pay | Admitting: Gastroenterology

## 2023-04-29 ENCOUNTER — Telehealth: Payer: Self-pay | Admitting: *Deleted

## 2023-04-29 ENCOUNTER — Encounter: Payer: Self-pay | Admitting: *Deleted

## 2023-04-29 VITALS — BP 117/79 | HR 76 | Temp 98.0°F | Ht 68.0 in | Wt 181.4 lb

## 2023-04-29 DIAGNOSIS — K625 Hemorrhage of anus and rectum: Secondary | ICD-10-CM

## 2023-04-29 DIAGNOSIS — Z86010 Personal history of colonic polyps: Secondary | ICD-10-CM

## 2023-04-29 MED ORDER — HYDROCORTISONE (PERIANAL) 2.5 % EX CREA: 1.0000 | TOPICAL_CREAM | Freq: Two times a day (BID) | CUTANEOUS | 0 refills | Status: DC

## 2023-04-29 NOTE — Patient Instructions (Signed)
Colonoscopy to be scheduled with Dr. Marletta Lor if Dr. Jena Gauss does not have any openings in the immediate future. See separate instructions to be provided.  Hydrocortisone cream to apply inside the anal opening twice daily for 2 weeks for hemorrhoids.

## 2023-04-29 NOTE — H&P (View-Only) (Signed)
   GI Office Note    Referring Provider: Jones, Kristi S, NP Primary Care Physician:  [facility]  Primary Gastroenterologist: Michael Rourk, MD   Chief Complaint   Chief Complaint  Patient presents with   Blood In Stools    Was having some loose stools and noticed the blood in the stools when stools started to harden back up.      History of Present Illness   Walter Harrison is a 71 y.o. male presenting today at the request of Kristi Jones, NP for rectal bleeding.  Several episodes of gas/blood in the stool in the past one month. History hemorrhoids. Feels like hemorrhoids are the issue. Can have 2-3 stools per day. Rare nocturnal stool if wakes up to urinate. Stools range from hard to loose. Pcp started him on stool softener recently as he noted more blood in the stool with harder stools.  Also has noted blood in the commode.  Bright red blood noted.  No melena.  Some mild lower abdominal discomfort.  Last episode of rectal bleeding about 3 days ago.  No upper GI complaints.  Patient has a history of prostate cancer.  Completed radiation treatments in December.Did not have any rectal bleeding while on XRT.   CT abdomen pelvis with and without contrast August 2023 showed moderate sized hiatal hernia, pancolonic diverticulosis, heterogeneous nodular enlargement of the prostate gland poorly evaluated on CT but similar appearance to MRI dated 2022 likely reflecting sequela of prior thermotherapy, nonobstructive left lower pole renal stone.  Bone scan with no evidence of metastatic disease.  Colonoscopy 07/2019: -diverticulosis -next colonoscopy in 7 years with personal history of colon polyps  EGD 2013: -Hiatal hernia (7 cm paraesophageal hernia, with wide open neck) -Gastritis   Medications   Current Outpatient Medications  Medication Sig Dispense Refill   allopurinol (ZYLOPRIM) 300 MG tablet Take 300 mg by mouth daily.     aspirin 81 MG chewable  tablet Chew 81 mg by mouth daily after breakfast.     chlorthalidone (HYGROTON) 25 MG tablet Take 12.5 mg by mouth daily after breakfast.      docusate sodium (COLACE) 100 MG capsule Take 100 mg by mouth 2 (two) times daily as needed.     Multiple Vitamin (MULTIVITAMIN WITH MINERALS) TABS tablet Take 1 tablet by mouth daily after breakfast. Centrum Silver     Relugolix (ORGOVYX PO) Take 120 mg by mouth daily.     tamsulosin (FLOMAX) 0.4 MG CAPS capsule Take 1 capsule (0.4 mg total) by mouth 2 (two) times daily after a meal. 180 capsule 3   No current facility-administered medications for this visit.    Allergies   Allergies as of 04/29/2023 - Review Complete 04/29/2023  Allergen Reaction Noted   Sulfa antibiotics Nausea Only 01/18/2013    Past Medical History   Past Medical History:  Diagnosis Date   BPH (benign prostatic hyperplasia)    Cancer (HCC)    Prostate   Hay fever    Hypertension    Pneumonia    Thalassemia 06/04/2012    Past Surgical History   Past Surgical History:  Procedure Laterality Date   COLONOSCOPY  2009   Dr. Patel: diverticulosis but h/o colon polyps on prior   COLONOSCOPY N/A 04/07/2014   Procedure: COLONOSCOPY;  Surgeon: Robert M Rourk, MD;  [facility];  Service: Endoscopy;  Laterality: N/A;  1:45   COLONOSCOPY N/A 07/21/2019   Procedure: COLONOSCOPY;  Surgeon: Rourk,   Robert M, MD;  [facility];  Service: Endoscopy;  Laterality: N/A;  8:30   ESOPHAGOGASTRODUODENOSCOPY     multiple times by Dr. Patel, 7-8 times. yearly for the hiatal hernia. no barrett's esophagus per patient.  Dr. Patel   ESOPHAGOGASTRODUODENOSCOPY  03/2012   Dr. Patel: [facility], 7cm paraesophageal hernia, gastritis   EXTRACORPOREAL SHOCK WAVE LITHOTRIPSY Left 07/18/2022   Procedure: EXTRACORPOREAL SHOCK WAVE LITHOTRIPSY (ESWL);  Surgeon: Wolff, Michael R, MD;  Location: [facility];  Service: Urology;  Laterality: Left;   HERNIA REPAIR     hiatal hernia repair, [facility]    TONSILLECTOMY     TRANSURETHRAL RESECTION OF PROSTATE      Past Family History   Family History  Problem Relation Age of Onset   Cancer - Prostate Father    Stroke Sister    Kidney disease Sister    Prostate cancer Brother        has had bladder cancer, now fighting four battle with cancer   Colon cancer Neg Hx     Past Social History   Social History   Socioeconomic History   Marital status: Married    Spouse name: Not on file   Number of children: 3   Years of education: Not on file   Highest education level: Not on file  Occupational History    Employer: [employer]  Tobacco Use   Smoking status: Never   Smokeless tobacco: Former  Vaping Use   Vaping Use: Never used  Substance and Sexual Activity   Alcohol use: No   Drug use: No   Sexual activity: Yes  Other Topics Concern   Not on file  Social History Narrative   Not on file   Social Determinants of Health   Financial Resource Strain: Not on file  Food Insecurity: Not on file  Transportation Needs: Not on file  Physical Activity: Not on file  Stress: Not on file  Social Connections: Not on file  Intimate Partner Violence: Not on file    Review of Systems   General: Negative for anorexia, weight loss, fever, chills, fatigue, weakness. Eyes: Negative for vision changes.  ENT: Negative for hoarseness, difficulty swallowing , nasal congestion. CV: Negative for chest pain, angina, palpitations, dyspnea on exertion, peripheral edema.  Respiratory: Negative for dyspnea at rest, dyspnea on exertion, cough, sputum, wheezing.  GI: See history of present illness. GU:  Negative for dysuria, hematuria, urinary incontinence, urinary frequency, nocturnal urination.  MS: Negative for joint pain, low back pain.  Derm: Negative for rash or itching.  Neuro: Negative for weakness, abnormal sensation, seizure, frequent headaches, memory loss,  confusion.  Psych: Negative for anxiety, depression, suicidal  ideation, hallucinations.  Endo: Negative for unusual weight change.  Heme: Negative for bruising or bleeding. Allergy: Negative for rash or hives.  Physical Exam   BP 117/79 (BP Location: Right Arm, Patient Position: Sitting, Cuff Size: Large)   Pulse 76   Temp 98 F (36.7 C) (Oral)   Ht 5' 8" (1.727 m)   Wt 181 lb 6.4 oz (82.3 kg)   SpO2 97%   BMI 27.58 kg/m    General: Well-nourished, well-developed in no acute distress.  Head: Normocephalic, atraumatic.   Eyes: Conjunctiva pink, no icterus. Mouth: Oropharyngeal mucosa moist and pink   Neck: Supple without thyromegaly, masses, or lymphadenopathy.  Lungs: Clear to auscultation bilaterally.  Heart: Regular rate and rhythm, no murmurs rubs or gallops.  Abdomen: Bowel sounds are normal, nontender, nondistended, no   hepatosplenomegaly or masses,  no abdominal bruits or hernia, no rebound or guarding.   Rectal: hemorrhoids noted externally, able to push in but prolapsed out. Palpable hemorrhoids internally with moderate tenderness. No masses. No frank blood or stool noted.  Extremities: No lower extremity edema. No clubbing or deformities.  Neuro: Alert and oriented x 4 , grossly normal neurologically.  Skin: Warm and dry, no rash or jaundice.   Psych: Alert and cooperative, normal mood and affect.  Labs   Lab Results  Component Value Date   PSA1 <0.1 04/18/2023   PSA1 <0.1 01/21/2023    Lab Results  Component Value Date   CREATININE 0.97 06/27/2022   BUN 14 06/27/2022   NA 138 06/27/2022   K 3.3 (L) 06/27/2022   CL 105 06/27/2022   CO2 23 06/27/2022   No results found for: "ALT", "AST", "GGT", "ALKPHOS", "BILITOT" Lab Results  Component Value Date   WBC 7.6 06/27/2022   HGB 15.0 06/27/2022   HCT 45.3 06/27/2022   MCV 89.3 06/27/2022   PLT 199 06/27/2022   No results found for: "ALT", "AST", "GGT", "ALKPHOS", "BILITOT"  Imaging Studies   No results found.  Assessment   Rectal bleeding/rectal pain: Suspect  benign anorectal source such as hemorrhoid disease but cannot exclude radiation proctitis or other etiology.  Last colonoscopy approaching 4 years therefore would recommend updating colonoscopy.  Patient requesting colonoscopy.   PLAN   Colonoscopy with Dr. Rourk.  ASA 2.  I have discussed the risks, alternatives, benefits with regards to but not limited to the risk of reaction to medication, bleeding, infection, perforation and the patient is agreeable to proceed. Written consent to be obtained. Hydrocortisone cream interactively twice daily for 2 weeks.   Omere Marti S. Yisrael Obryan, MHS, PA-C [facility]  

## 2023-04-29 NOTE — Progress Notes (Signed)
GI Office Note    Referring Provider: Karl Bales, NP Primary Care Physician:  [facility]  Primary Gastroenterologist: Roetta Sessions, MD   Chief Complaint   Chief Complaint  Patient presents with   Blood In Stools    Was having some loose stools and noticed the blood in the stools when stools started to harden back up.      History of Present Illness   Walter Harrison is a 72 y.o. male presenting today at the request of Seward Carol, NP for rectal bleeding.  Several episodes of gas/blood in the stool in the past one month. History hemorrhoids. Feels like hemorrhoids are the issue. Can have 2-3 stools per day. Rare nocturnal stool if wakes up to urinate. Stools range from hard to loose. Pcp started him on stool softener recently as he noted more blood in the stool with harder stools.  Also has noted blood in the commode.  Bright red blood noted.  No melena.  Some mild lower abdominal discomfort.  Last episode of rectal bleeding about 3 days ago.  No upper GI complaints.  Patient has a history of prostate cancer.  Completed radiation treatments in December.Did not have any rectal bleeding while on XRT.   CT abdomen pelvis with and without contrast August 2023 showed moderate sized hiatal hernia, pancolonic diverticulosis, heterogeneous nodular enlargement of the prostate gland poorly evaluated on CT but similar appearance to MRI dated 2022 likely reflecting sequela of prior thermotherapy, nonobstructive left lower pole renal stone.  Bone scan with no evidence of metastatic disease.  Colonoscopy 07/2019: -diverticulosis -next colonoscopy in 7 years with personal history of colon polyps  EGD 2013: -Hiatal hernia (7 cm paraesophageal hernia, with wide open neck) -Gastritis   Medications   Current Outpatient Medications  Medication Sig Dispense Refill   allopurinol (ZYLOPRIM) 300 MG tablet Take 300 mg by mouth daily.     aspirin 81 MG chewable  tablet Chew 81 mg by mouth daily after breakfast.     chlorthalidone (HYGROTON) 25 MG tablet Take 12.5 mg by mouth daily after breakfast.      docusate sodium (COLACE) 100 MG capsule Take 100 mg by mouth 2 (two) times daily as needed.     Multiple Vitamin (MULTIVITAMIN WITH MINERALS) TABS tablet Take 1 tablet by mouth daily after breakfast. Centrum Silver     Relugolix (ORGOVYX PO) Take 120 mg by mouth daily.     tamsulosin (FLOMAX) 0.4 MG CAPS capsule Take 1 capsule (0.4 mg total) by mouth 2 (two) times daily after a meal. 180 capsule 3   No current facility-administered medications for this visit.    Allergies   Allergies as of 04/29/2023 - Review Complete 04/29/2023  Allergen Reaction Noted   Sulfa antibiotics Nausea Only 01/18/2013    Past Medical History   Past Medical History:  Diagnosis Date   BPH (benign prostatic hyperplasia)    Cancer (HCC)    Prostate   Hay fever    Hypertension    Pneumonia    Thalassemia 06/04/2012    Past Surgical History   Past Surgical History:  Procedure Laterality Date   COLONOSCOPY  2009   Dr. Allena Katz: diverticulosis but h/o colon polyps on prior   COLONOSCOPY N/A 04/07/2014   Procedure: COLONOSCOPY;  Surgeon: Corbin Ade, MD;  [facility]: [facility];  Service: Endoscopy;  Laterality: N/A;  1:45   COLONOSCOPY N/A 07/21/2019   Procedure: COLONOSCOPY;  Surgeon: Jena Gauss,  [name], MD;  Location: [facility];  Service: Endoscopy;  Laterality: N/A;  8:30   ESOPHAGOGASTRODUODENOSCOPY     multiple times by Dr. Allena Katz, 7-8 times. yearly for the hiatal hernia. no barrett's esophagus per patient.  Dr. Allena Katz   ESOPHAGOGASTRODUODENOSCOPY  03/2012   Dr. Allena Katz: [facility], 7cm paraesophageal hernia, gastritis   EXTRACORPOREAL SHOCK WAVE LITHOTRIPSY Left 07/18/2022   Procedure: EXTRACORPOREAL SHOCK WAVE LITHOTRIPSY (ESWL);  Surgeon: Orson Ape, MD;  Location: [facility];  Service: Urology;  Laterality: Left;   HERNIA REPAIR     hiatal hernia repair, [facility]    TONSILLECTOMY     TRANSURETHRAL RESECTION OF PROSTATE      Past Family History   Family History  Problem Relation Age of Onset   Cancer - Prostate Father    Stroke Sister    Kidney disease Sister    Prostate cancer Brother        has had bladder cancer, now fighting four battle with cancer   Colon cancer Neg Hx     Past Social History   Social History   Socioeconomic History   Marital status: Married    Spouse name: Not on file   Number of children: 3   Years of education: Not on file   Highest education level: Not on file  Occupational History    Employer: [employer]  Tobacco Use   Smoking status: Never   Smokeless tobacco: Former  Building services engineer Use: Never used  Substance and Sexual Activity   Alcohol use: No   Drug use: No   Sexual activity: Yes  Other Topics Concern   Not on file  Social History Narrative   Not on file   Social Determinants of Health   Financial Resource Strain: Not on file  Food Insecurity: Not on file  Transportation Needs: Not on file  Physical Activity: Not on file  Stress: Not on file  Social Connections: Not on file  Intimate Partner Violence: Not on file    Review of Systems   General: Negative for anorexia, weight loss, fever, chills, fatigue, weakness. Eyes: Negative for vision changes.  ENT: Negative for hoarseness, difficulty swallowing , nasal congestion. CV: Negative for chest pain, angina, palpitations, dyspnea on exertion, peripheral edema.  Respiratory: Negative for dyspnea at rest, dyspnea on exertion, cough, sputum, wheezing.  GI: See history of present illness. GU:  Negative for dysuria, hematuria, urinary incontinence, urinary frequency, nocturnal urination.  MS: Negative for joint pain, low back pain.  Derm: Negative for rash or itching.  Neuro: Negative for weakness, abnormal sensation, seizure, frequent headaches, memory loss,  confusion.  Psych: Negative for anxiety, depression, suicidal  ideation, hallucinations.  Endo: Negative for unusual weight change.  Heme: Negative for bruising or bleeding. Allergy: Negative for rash or hives.  Physical Exam   BP 117/79 (BP Location: Right Arm, Patient Position: Sitting, Cuff Size: Large)   Pulse 76   Temp 98 F (36.7 C) (Oral)   Ht 5\' 8"  (1.727 m)   Wt 181 lb 6.4 oz (82.3 kg)   SpO2 97%   BMI 27.58 kg/m    General: Well-nourished, well-developed in no acute distress.  Head: Normocephalic, atraumatic.   Eyes: Conjunctiva pink, no icterus. Mouth: Oropharyngeal mucosa moist and pink   Neck: Supple without thyromegaly, masses, or lymphadenopathy.  Lungs: Clear to auscultation bilaterally.  Heart: Regular rate and rhythm, no murmurs rubs or gallops.  Abdomen: Bowel sounds are normal, nontender, nondistended, no  hepatosplenomegaly or masses,  no abdominal bruits or hernia, no rebound or guarding.   Rectal: hemorrhoids noted externally, able to push in but prolapsed out. Palpable hemorrhoids internally with moderate tenderness. No masses. No frank blood or stool noted.  Extremities: No lower extremity edema. No clubbing or deformities.  Neuro: Alert and oriented x 4 , grossly normal neurologically.  Skin: Warm and dry, no rash or jaundice.   Psych: Alert and cooperative, normal mood and affect.  Labs   Lab Results  Component Value Date   PSA1 <0.1 04/18/2023   PSA1 <0.1 01/21/2023    Lab Results  Component Value Date   CREATININE 0.97 06/27/2022   BUN 14 06/27/2022   NA 138 06/27/2022   K 3.3 (L) 06/27/2022   CL 105 06/27/2022   CO2 23 06/27/2022   No results found for: "ALT", "AST", "GGT", "ALKPHOS", "BILITOT" Lab Results  Component Value Date   WBC 7.6 06/27/2022   HGB 15.0 06/27/2022   HCT 45.3 06/27/2022   MCV 89.3 06/27/2022   PLT 199 06/27/2022   No results found for: "ALT", "AST", "GGT", "ALKPHOS", "BILITOT"  Imaging Studies   No results found.  Assessment   Rectal bleeding/rectal pain: Suspect  benign anorectal source such as hemorrhoid disease but cannot exclude radiation proctitis or other etiology.  Last colonoscopy approaching 4 years therefore would recommend updating colonoscopy.  Patient requesting colonoscopy.   PLAN   Colonoscopy with Dr. Jena Gauss.  ASA 2.  I have discussed the risks, alternatives, benefits with regards to but not limited to the risk of reaction to medication, bleeding, infection, perforation and the patient is agreeable to proceed. Written consent to be obtained. Hydrocortisone cream interactively twice daily for 2 weeks.   Leanna Battles. Melvyn Neth, MHS, PA-C [facility]

## 2023-04-29 NOTE — Telephone Encounter (Signed)
PA approved via cohere. Auth# [id number], DOS: 05/12/2023 - 07/13/2023

## 2023-04-29 NOTE — Progress Notes (Signed)
Pt requested to have lab done at [facility] in [place]. Gave lab order in office to have done.

## 2023-04-30 ENCOUNTER — Encounter: Payer: Self-pay | Admitting: Gastroenterology

## 2023-05-01 LAB — BASIC METABOLIC PANEL
BUN/Creatinine Ratio: 11 (ref 10–24)
BUN: 12 mg/dL (ref 8–27)
CO2: 26 mmol/L (ref 20–29)
Calcium: 10 mg/dL (ref 8.6–10.2)
Chloride: 105 mmol/L (ref 96–106)
Creatinine, Ser: 1.09 mg/dL (ref 0.76–1.27)
Glucose: 164 mg/dL — ABNORMAL HIGH (ref 70–99)
Potassium: 3.5 mmol/L (ref 3.5–5.2)
Sodium: 144 mmol/L (ref 134–144)
eGFR: 73 mL/min/{1.73_m2} (ref >59)

## 2023-05-12 ENCOUNTER — Ambulatory Visit (HOSPITAL_COMMUNITY): Payer: Medicare PPO | Admitting: Anesthesiology

## 2023-05-12 ENCOUNTER — Ambulatory Visit (HOSPITAL_BASED_OUTPATIENT_CLINIC_OR_DEPARTMENT_OTHER): Payer: Medicare PPO | Admitting: Anesthesiology

## 2023-05-12 ENCOUNTER — Ambulatory Visit (HOSPITAL_COMMUNITY)
Admission: RE | Admit: 2023-05-12 | Discharge: 2023-05-12 | Disposition: A | Discharge status: Home / Self Care | Payer: Medicare PPO | Attending: Internal Medicine | Admitting: Internal Medicine

## 2023-05-12 ENCOUNTER — Encounter (HOSPITAL_COMMUNITY): Payer: Self-pay | Admitting: Internal Medicine

## 2023-05-12 ENCOUNTER — Encounter (HOSPITAL_COMMUNITY)
Admission: RE | Disposition: A | Discharge status: Home / Self Care | Payer: Self-pay | Source: Home / Self Care | Attending: Internal Medicine

## 2023-05-12 ENCOUNTER — Other Ambulatory Visit: Payer: Self-pay

## 2023-05-12 DIAGNOSIS — K6389 Other specified diseases of intestine: Secondary | ICD-10-CM

## 2023-05-12 DIAGNOSIS — I10 Essential (primary) hypertension: Secondary | ICD-10-CM | POA: Insufficient documentation

## 2023-05-12 DIAGNOSIS — K625 Hemorrhage of anus and rectum: Secondary | ICD-10-CM

## 2023-05-12 DIAGNOSIS — Z8546 Personal history of malignant neoplasm of prostate: Secondary | ICD-10-CM | POA: Insufficient documentation

## 2023-05-12 DIAGNOSIS — K449 Diaphragmatic hernia without obstruction or gangrene: Secondary | ICD-10-CM | POA: Diagnosis not present

## 2023-05-12 DIAGNOSIS — K5730 Diverticulosis of large intestine without perforation or abscess without bleeding: Secondary | ICD-10-CM

## 2023-05-12 DIAGNOSIS — K921 Melena: Secondary | ICD-10-CM | POA: Insufficient documentation

## 2023-05-12 DIAGNOSIS — Z923 Personal history of irradiation: Secondary | ICD-10-CM | POA: Diagnosis not present

## 2023-05-12 HISTORY — PX: HOT HEMOSTASIS: SHX5433

## 2023-05-12 HISTORY — PX: COLONOSCOPY WITH PROPOFOL: SHX5780

## 2023-05-12 SURGERY — COLONOSCOPY WITH PROPOFOL
Anesthesia: General

## 2023-05-12 MED ORDER — PHENYLEPHRINE 80 MCG/ML (10ML) SYRINGE FOR IV PUSH (FOR BLOOD PRESSURE SUPPORT)
PREFILLED_SYRINGE | INTRAVENOUS | Status: DC | PRN
  Administered 2023-05-12 (×2): 160 ug via INTRAVENOUS

## 2023-05-12 MED ORDER — LIDOCAINE HCL (CARDIAC) PF 100 MG/5ML IV SOSY
PREFILLED_SYRINGE | INTRAVENOUS | Status: DC | PRN
  Administered 2023-05-12: 50 mg via INTRAVENOUS

## 2023-05-12 MED ORDER — PROPOFOL 500 MG/50ML IV EMUL
INTRAVENOUS | Status: DC | PRN
  Administered 2023-05-12: 150 ug/kg/min via INTRAVENOUS

## 2023-05-12 MED ORDER — STERILE WATER FOR IRRIGATION IR SOLN
Status: DC | PRN
  Administered 2023-05-12: 60 mL

## 2023-05-12 MED ORDER — PROPOFOL 10 MG/ML IV BOLUS
INTRAVENOUS | Status: DC | PRN
  Administered 2023-05-12: 100 mg via INTRAVENOUS

## 2023-05-12 MED ORDER — LACTATED RINGERS IV SOLN: INTRAVENOUS | Status: DC

## 2023-05-12 NOTE — Interval H&P Note (Signed)
History and Physical Interval Note:  05/12/2023 12:31 PM  Walter Harrison  has presented today for surgery, with the diagnosis of RB.  The various methods of treatment have been discussed with the patient and family. After consideration of risks, benefits and other options for treatment, the patient has consented to  Procedure(s) with comments: COLONOSCOPY WITH PROPOFOL (N/A) - 1:15pm, asa 2 as a surgical intervention.  The patient's history has been reviewed, patient examined, no change in status, stable for surgery.  I have reviewed the patient's chart and labs.  Questions were answered to the patient's satisfaction.     Honest Vanleer   no change.  Diagnostic colonoscopy per plan. The risks, benefits, limitations, alternatives and imponderables have been reviewed with the patient. Questions have been answered. All parties are agreeable.

## 2023-05-12 NOTE — Anesthesia Postprocedure Evaluation (Signed)
Anesthesia Post Note  Patient: Walter Harrison  Procedure(s) Performed: COLONOSCOPY WITH PROPOFOL HOT HEMOSTASIS (ARGON PLASMA COAGULATION/BICAP)  Patient location during evaluation: Phase II Anesthesia Type: General Level of consciousness: awake and alert and oriented Pain management: pain level controlled Vital Signs Assessment: post-procedure vital signs reviewed and stable Respiratory status: spontaneous breathing, nonlabored ventilation and respiratory function stable Cardiovascular status: blood pressure returned to baseline and stable Postop Assessment: no apparent nausea or vomiting Anesthetic complications: no  No notable events documented.   Last Vitals:  Vitals:   05/12/23 1258 05/12/23 1304  BP: (!) 89/48 96/68  Pulse: 83   Resp: 18   Temp: 36.4 C   SpO2: 95%     Last Pain:  Vitals:   05/12/23 1300  TempSrc:   PainSc: 0-No pain                 Cecia Egge C Amor Hyle

## 2023-05-12 NOTE — Op Note (Signed)
[facility] Patient Name: Walter Harrison Procedure Date: 05/12/2023 12:22 PM MRN: [id number] Date of Birth: May 11, 1951 Attending MD: Gennette Pac , MD, [id number] CSN: [id number] Age: 73 Admit Type: Outpatient Procedure:                Colonoscopy Indications:              Hematochezia Providers:                Gennette Pac, MD, Crystal Page, Zena Amos Referring MD:              Medicines:                Propofol per Anesthesia Complications:            No immediate complications. Estimated Blood Loss:     Estimated blood loss: none. Procedure:                Pre-Anesthesia Assessment:                           - Prior to the procedure, a History and Physical                            was performed, and patient medications and                            allergies were reviewed. The patient's tolerance of                            previous anesthesia was also reviewed. The risks                            and benefits of the procedure and the sedation                            options and risks were discussed with the patient.                            All questions were answered, and informed consent                            was obtained. Prior Anticoagulants: The patient has                            taken no anticoagulant or antiplatelet agents. ASA                            Grade Assessment: II - A patient with mild systemic                            disease. After reviewing the risks and benefits,                            the  patient was deemed in satisfactory condition to                            undergo the procedure.                           After obtaining informed consent, the colonoscope                            was passed under direct vision. Throughout the                            procedure, the patient's blood pressure, pulse, and                            oxygen saturations were monitored continuously.  The                            [id number]) scope was introduced through the                            anus and advanced to the the cecum, identified by                            appendiceal orifice and ileocecal valve. The                            colonoscopy was performed without difficulty. The                            patient tolerated the procedure well. The quality                            of the bowel preparation was adequate. The                            ileocecal valve, appendiceal orifice, and rectum                            were photographed. The colonoscopy was performed                            without difficulty. The patient tolerated the                            procedure well. The quality of the bowel                            preparation was adequate. Scope In: 12:42:33 PM Scope Out: 12:55:14 PM Scope Withdrawal Time: 0 hours 7 minutes 16 seconds  Total Procedure Duration: 0 hours 12 minutes 41 seconds  Findings:      The perianal and digital rectal examinations were normal. 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      Many medium-mouthed diverticula were found in the entire colon.       Remainder of colonic colon appeared Normal. Utilizing the APC with a       circular probe at 20 J. Neovascular lesions were discretely treated in       point fashion, avoiding regional tissue ablation. Impression:               - Diverticulosis in the entire examined colon.                           -Neovascular changes consistent with radiation                            proctitis. Status post APC treatment Moderate Sedation:      Moderate (conscious) sedation was personally administered by an       anesthesia professional. The following parameters were monitored: oxygen       saturation, heart rate, blood pressure, respiratory rate, EKG, adequacy       of pulmonary ventilation, and response  to care. Recommendation:           - Patient has a contact number available for                            emergencies. The signs and symptoms of potential                            delayed complications were discussed with the                            patient. Return to normal activities tomorrow.                            Written discharge instructions were provided to the                            patient.                           - Continue present medications.                           - Advance diet as tolerated. Begin Benefiber daily.                            Office visit in 3 months. Repeat colonoscopy for                            polyp surveillance in 7 years. Patient and wife                            understand that retreatment of his rectum may be                            needed depending on clinical course. Procedure Code(s):        --- Professional ---  16109, Colonoscopy, flexible; diagnostic, including                            collection of specimen(s) by brushing or washing,                            when performed (separate procedure) Diagnosis Code(s):        --- Professional ---                           K92.1, Melena (includes Hematochezia)                           K57.30, Diverticulosis of large intestine without                            perforation or abscess without bleeding CPT copyright 2022 American Medical Association. All rights reserved. The codes documented in this report are preliminary and upon coder review may  be revised to meet current compliance requirements. [name]. [name], MD Gennette Pac, MD 05/12/2023 1:09:53 PM This report has been signed electronically. Number of Addenda: 0

## 2023-05-12 NOTE — Anesthesia Preprocedure Evaluation (Addendum)
Anesthesia Evaluation  Patient identified by MRN, date of birth, ID band Patient awake    Reviewed: Allergy & Precautions, H&P , NPO status , Patient's Chart, lab work & pertinent test results  Airway Mallampati: III  TM Distance: >3 FB Neck ROM: Full    Dental  (+) Dental Advisory Given, Missing   Pulmonary pneumonia   Pulmonary exam normal breath sounds clear to auscultation       Cardiovascular Exercise Tolerance: Good hypertension, Pt. on medications Normal cardiovascular exam Rhythm:Regular Rate:Normal     Neuro/Psych negative neurological ROS  negative psych ROS   GI/Hepatic Neg liver ROS,GERD  Controlled,,  Endo/Other  negative endocrine ROS    Renal/GU negative Renal ROS Bladder dysfunction (prostate cancer)      Musculoskeletal negative musculoskeletal ROS (+)    Abdominal   Peds negative pediatric ROS (+)  Hematology  (+) Blood dyscrasia, anemia   Anesthesia Other Findings   Reproductive/Obstetrics negative OB ROS                             Anesthesia Physical Anesthesia Plan  ASA: 2  Anesthesia Plan: General   Post-op Pain Management: Minimal or no pain anticipated   Induction: Intravenous  PONV Risk Score and Plan: 1 and Treatment may vary due to age or medical condition  Airway Management Planned: Nasal Cannula and Natural Airway  Additional Equipment:   Intra-op Plan:   Post-operative Plan:   Informed Consent: I have reviewed the patients History and Physical, chart, labs and discussed the procedure including the risks, benefits and alternatives for the proposed anesthesia with the patient or authorized representative who has indicated his/her understanding and acceptance.     Dental advisory given  Plan Discussed with: CRNA and Surgeon  Anesthesia Plan Comments:        Anesthesia Quick Evaluation

## 2023-05-12 NOTE — Transfer of Care (Signed)
Immediate Anesthesia Transfer of Care Note  Patient: Walter Harrison  Procedure(s) Performed: COLONOSCOPY WITH PROPOFOL HOT HEMOSTASIS (ARGON PLASMA COAGULATION/BICAP)  Patient Location: Endoscopy Unit  Anesthesia Type:General  Level of Consciousness: drowsy  Airway & Oxygen Therapy: Patient Spontanous Breathing  Post-op Assessment: Report given to RN and Post -op Vital signs reviewed and stable  Post vital signs: Reviewed and stable  Last Vitals:  Vitals Value Taken Time  BP 89/48 05/12/23 1258  Temp 36.4 C 05/12/23 1258  Pulse 83 05/12/23 1258  Resp 18 05/12/23 1258  SpO2 95 % 05/12/23 1258    Last Pain:  Vitals:   05/12/23 1258  TempSrc: Oral  PainSc:       Patients Stated Pain Goal: 8 (05/12/23 1133)  Complications: No notable events documented.

## 2023-05-12 NOTE — Anesthesia Procedure Notes (Signed)
Date/Time: 05/12/2023 12:51 PM  Performed by: Julian Reil, CRNAPre-anesthesia Checklist: Patient identified, Emergency Drugs available, Suction available and Patient being monitored Patient Re-evaluated:Patient Re-evaluated prior to induction Oxygen Delivery Method: Nasal cannula Induction Type: IV induction Placement Confirmation: positive ETCO2

## 2023-05-12 NOTE — Discharge Instructions (Addendum)
  Colonoscopy Discharge Instructions  Read the instructions outlined below and refer to this sheet in the next few weeks. These discharge instructions provide you with general information on caring for yourself after you leave the hospital. Your doctor may also give you specific instructions. While your treatment has been planned according to the most current medical practices available, unavoidable complications occasionally occur. If you have any problems or questions after discharge, call Dr. Jena Gauss at [phone]. ACTIVITY You may resume your regular activity, but move at a slower pace for the next 24 hours.  Take frequent rest periods for the next 24 hours.  Walking will help get rid of the air and reduce the bloated feeling in your belly (abdomen).  No driving for 24 hours (because of the medicine (anesthesia) used during the test).   Do not sign any important legal documents or operate any machinery for 24 hours (because of the anesthesia used during the test).  NUTRITION Drink plenty of fluids.  You may resume your normal diet as instructed by your doctor.  Begin with a light meal and progress to your normal diet. Heavy or fried foods are harder to digest and may make you feel sick to your stomach (nauseated).  Avoid alcoholic beverages for 24 hours or as instructed.  MEDICATIONS You may resume your normal medications unless your doctor tells you otherwise.  WHAT YOU CAN EXPECT TODAY Some feelings of bloating in the abdomen.  Passage of more gas than usual.  Spotting of blood in your stool or on the toilet paper.  IF YOU HAD POLYPS REMOVED DURING THE COLONOSCOPY: No aspirin products for 7 days or as instructed.  No alcohol for 7 days or as instructed.  Eat a soft diet for the next 24 hours.  FINDING OUT THE RESULTS OF YOUR TEST Not all test results are available during your visit. If your test results are not back during the visit, make an appointment with your caregiver to find out the  results. Do not assume everything is normal if you have not heard from your caregiver or the medical facility. It is important for you to follow up on all of your test results.  SEEK IMMEDIATE MEDICAL ATTENTION IF: You have more than a spotting of blood in your stool.  Your belly is swollen (abdominal distention).  You are nauseated or vomiting.  You have a temperature over 101.  You have abdominal pain or discomfort that is severe or gets worse throughout the day.      diverticulosis found.  Diverticulosis information provided  Rectal bleeding due to radiation proctitis (abnormal blood vessels in the rectum from radiation treatment).  They were coagulated today so they would not bleed in the future   you may or may not really need retreatment   begin Benefiber 1 tablespoon daily for 3 weeks; then increase to 2 tablespoons thereafter   repeat colonoscopy in 7 years for polyp surveillance  Office visit with [place] in 3 months  At patient request, I called Warneda Emrich at [phone] findings and recommendations

## 2023-05-16 ENCOUNTER — Encounter (HOSPITAL_COMMUNITY): Payer: Self-pay | Admitting: Internal Medicine

## 2023-06-27 ENCOUNTER — Encounter (HOSPITAL_COMMUNITY): Payer: Self-pay | Admitting: Emergency Medicine

## 2023-06-27 ENCOUNTER — Emergency Department (HOSPITAL_COMMUNITY)
Admission: EM | Admit: 2023-06-27 | Discharge: 2023-06-27 | Disposition: A | Discharge status: Home / Self Care | Payer: Medicare PPO | Attending: Emergency Medicine | Admitting: Emergency Medicine

## 2023-06-27 ENCOUNTER — Other Ambulatory Visit: Payer: Self-pay

## 2023-06-27 ENCOUNTER — Emergency Department (HOSPITAL_COMMUNITY): Payer: Medicare PPO

## 2023-06-27 DIAGNOSIS — R112 Nausea with vomiting, unspecified: Secondary | ICD-10-CM | POA: Diagnosis present

## 2023-06-27 DIAGNOSIS — R197 Diarrhea, unspecified: Secondary | ICD-10-CM | POA: Insufficient documentation

## 2023-06-27 DIAGNOSIS — N23 Unspecified renal colic: Secondary | ICD-10-CM

## 2023-06-27 LAB — COMPREHENSIVE METABOLIC PANEL
ALT: 36 U/L (ref 0–44)
AST: 31 U/L (ref 15–41)
Albumin: 4 g/dL (ref 3.5–5.0)
Alkaline Phosphatase: 72 U/L (ref 38–126)
Anion gap: 10 (ref 5–15)
BUN: 15 mg/dL (ref 8–23)
CO2: 25 mmol/L (ref 22–32)
Calcium: 9.6 mg/dL (ref 8.9–10.3)
Chloride: 105 mmol/L (ref 98–111)
Creatinine, Ser: 1.26 mg/dL — ABNORMAL HIGH (ref 0.61–1.24)
GFR, Estimated: >60 mL/min (ref >60)
Glucose, Bld: 156 mg/dL — ABNORMAL HIGH (ref 70–99)
Potassium: 3.4 mmol/L — ABNORMAL LOW (ref 3.5–5.1)
Sodium: 140 mmol/L (ref 135–145)
Total Bilirubin: 0.6 mg/dL (ref 0.3–1.2)
Total Protein: 7.4 g/dL (ref 6.5–8.1)

## 2023-06-27 LAB — URINALYSIS, ROUTINE W REFLEX MICROSCOPIC
Bacteria, UA: NONE SEEN
Bilirubin Urine: NEGATIVE
Glucose, UA: NEGATIVE mg/dL
Ketones, ur: NEGATIVE mg/dL
Leukocytes,Ua: NEGATIVE
Nitrite: NEGATIVE
Protein, ur: NEGATIVE mg/dL
Specific Gravity, Urine: 1.038 — ABNORMAL HIGH (ref 1.005–1.030)
pH: 5 (ref 5.0–8.0)

## 2023-06-27 LAB — CBC WITH DIFFERENTIAL/PLATELET
Abs Immature Granulocytes: 0.03 10*3/uL (ref 0.00–0.07)
Basophils Absolute: 0 10*3/uL (ref 0.0–0.1)
Basophils Relative: 0 %
Eosinophils Absolute: 0.1 10*3/uL (ref 0.0–0.5)
Eosinophils Relative: 2 %
HCT: 38.7 % — ABNORMAL LOW (ref 39.0–52.0)
Hemoglobin: 12.8 g/dL — ABNORMAL LOW (ref 13.0–17.0)
Immature Granulocytes: 0 %
Lymphocytes Relative: 11 %
Lymphs Abs: 0.7 10*3/uL (ref 0.7–4.0)
MCH: 29.8 pg (ref 26.0–34.0)
MCHC: 33.1 g/dL (ref 30.0–36.0)
MCV: 90.2 fL (ref 80.0–100.0)
Monocytes Absolute: 0.6 10*3/uL (ref 0.1–1.0)
Monocytes Relative: 9 %
Neutro Abs: 5.4 10*3/uL (ref 1.7–7.7)
Neutrophils Relative %: 78 %
Platelets: 209 10*3/uL (ref 150–400)
RBC: 4.29 MIL/uL (ref 4.22–5.81)
RDW: 14.5 % (ref 11.5–15.5)
WBC: 6.9 10*3/uL (ref 4.0–10.5)
nRBC: 0 % (ref 0.0–0.2)

## 2023-06-27 LAB — LIPASE, BLOOD: Lipase: 37 U/L (ref 11–51)

## 2023-06-27 MED ORDER — IOHEXOL 300 MG/ML  SOLN
100.0000 mL | Freq: Once | INTRAMUSCULAR | Status: AC | PRN
  Administered 2023-06-27: 100 mL via INTRAVENOUS

## 2023-06-27 MED ORDER — OXYCODONE-ACETAMINOPHEN 5-325 MG PO TABS: 1.0000 | ORAL_TABLET | ORAL | 0 refills | Status: DC | PRN

## 2023-06-27 MED ORDER — SODIUM CHLORIDE 0.9 % IV BOLUS
500.0000 mL | Freq: Once | INTRAVENOUS | Status: AC
  Administered 2023-06-27: 500 mL via INTRAVENOUS

## 2023-06-27 MED ORDER — HYDROMORPHONE HCL 1 MG/ML IJ SOLN
1.0000 mg | Freq: Once | INTRAMUSCULAR | Status: AC
  Administered 2023-06-27: 1 mg via INTRAVENOUS
  Filled 2023-06-27: qty 1

## 2023-06-27 NOTE — ED Triage Notes (Signed)
Pt in via [facility] with LLQ pain, onset at 9pm after eating late dinner. Pt endorses n/v/d since, was given 4mg  zofran en route.

## 2023-06-27 NOTE — Discharge Instructions (Signed)
Continue to take Flomax.  Take the Percocet as needed for pain.  If you develop a fever, uncontrolled vomiting or pain not controlled by your Percocet, return to the emergency department.

## 2023-06-27 NOTE — ED Provider Notes (Signed)
[place] EMERGENCY DEPARTMENT AT [facility] Provider Note   CSN: [id number] Arrival date & time: 06/27/23  5284     History  Chief Complaint  Patient presents with   Abdominal Pain   Emesis    Walter Harrison is a 72 y.o. male.  Comes to the emergency by ambulance with complaints of nausea, vomiting, diarrhea.  Patient complaining of pain in the left lower abdomen.  Symptoms began around 10:30 PM.       Home Medications Prior to Admission medications   Medication Sig Start Date End Date Taking? Authorizing Provider  oxyCODONE-acetaminophen (PERCOCET) 5-325 MG tablet Take 1-2 tablets by mouth every 4 (four) hours as needed. 06/27/23  Yes Darren Nodal, Canary Brim, MD  oxyCODONE-acetaminophen (PERCOCET/ROXICET) 5-325 MG tablet Take 1 tablet by mouth every 4 (four) hours as needed for severe pain. 06/27/23  Yes Klyde Banka, Canary Brim, MD  allopurinol (ZYLOPRIM) 300 MG tablet Take 300 mg by mouth daily. 05/18/22   [provider]  aspirin 81 MG chewable tablet Chew 81 mg by mouth daily after breakfast.    [provider]  chlorthalidone (HYGROTON) 25 MG tablet Take 12.5 mg by mouth daily after breakfast.     [provider]  docusate sodium (COLACE) 100 MG capsule Take 100 mg by mouth 2 (two) times daily. 04/23/23   [provider]  hydrocortisone (ANUSOL-HC) 2.5 % rectal cream Place 1 Application rectally 2 (two) times daily. For 2 weeks. 04/29/23   Tiffany Kocher, PA-C  Multiple Vitamin (MULTIVITAMIN WITH MINERALS) TABS tablet Take 1 tablet by mouth daily after breakfast. Centrum Silver    [provider]  relugolix (ORGOVYX) 120 MG tablet Take 120 mg by mouth daily. 06/20/22   [provider]  tamsulosin (FLOMAX) 0.4 MG CAPS capsule Take 1 capsule (0.4 mg total) by mouth 2 (two) times daily after a meal. 04/18/23   McKenzie, Mardene Celeste, MD      Allergies    Sulfa antibiotics    Review of Systems   Review of  Systems  Physical Exam Updated Vital Signs BP 112/64 (BP Location: Right Arm)   Pulse (!) 57   Temp 97.7 F (36.5 C)   Resp 16   Wt 81.6 kg   SpO2 91%   BMI 27.37 kg/m  Physical Exam Vitals and nursing note reviewed.  Constitutional:      General: He is not in acute distress.    Appearance: He is well-developed.  HENT:     Head: Normocephalic and atraumatic.     Mouth/Throat:     Mouth: Mucous membranes are moist.  Eyes:     General: Vision grossly intact. Gaze aligned appropriately.     Extraocular Movements: Extraocular movements intact.     Conjunctiva/sclera: Conjunctivae normal.  Cardiovascular:     Rate and Rhythm: Normal rate and regular rhythm.     Pulses: Normal pulses.     Heart sounds: Normal heart sounds, S1 normal and S2 normal. No murmur heard.    No friction rub. No gallop.  Pulmonary:     Effort: Pulmonary effort is normal. No respiratory distress.     Breath sounds: Normal breath sounds.  Abdominal:     Palpations: Abdomen is soft.     Tenderness: There is abdominal tenderness in the left lower quadrant. There is no guarding or rebound.     Hernia: No hernia is present.  Musculoskeletal:        General: No swelling.  Cervical back: Full passive range of motion without pain, normal range of motion and neck supple. No pain with movement, spinous process tenderness or muscular tenderness. Normal range of motion.     Right lower leg: No edema.     Left lower leg: No edema.  Skin:    General: Skin is warm and dry.     Capillary Refill: Capillary refill takes less than 2 seconds.     Findings: No ecchymosis, erythema, lesion or wound.  Neurological:     Mental Status: He is alert and oriented to person, place, and time.     GCS: GCS eye subscore is 4. GCS verbal subscore is 5. GCS motor subscore is 6.     Cranial Nerves: Cranial nerves 2-12 are intact.     Sensory: Sensation is intact.     Motor: Motor function is intact. No weakness or abnormal  muscle tone.     Coordination: Coordination is intact.  Psychiatric:        Mood and Affect: Mood normal.        Speech: Speech normal.        Behavior: Behavior normal.     ED Results / Procedures / Treatments   Labs (all labs ordered are listed, but only abnormal results are displayed) Labs Reviewed  CBC WITH DIFFERENTIAL/PLATELET - Abnormal; Notable for the following components:      Result Value   Hemoglobin 12.8 (*)    HCT 38.7 (*)    All other components within normal limits  COMPREHENSIVE METABOLIC PANEL - Abnormal; Notable for the following components:   Potassium 3.4 (*)    Glucose, Bld 156 (*)    Creatinine, Ser 1.26 (*)    All other components within normal limits  URINALYSIS, ROUTINE W REFLEX MICROSCOPIC - Abnormal; Notable for the following components:   Specific Gravity, Urine 1.038 (*)    Hgb urine dipstick LARGE (*)    All other components within normal limits  LIPASE, BLOOD    EKG EKG Interpretation Date/Time:  Friday June 27 2023 02:45:06 EDT Ventricular Rate:  70 PR Interval:  169 QRS Duration:  91 QT Interval:  413 QTC Calculation: 446 R Axis:   36  Text Interpretation: Sinus rhythm Normal ECG Confirmed by Gilda Crease [phone]) on 06/27/2023 2:48:34 AM  Radiology CT ABDOMEN PELVIS W CONTRAST  Result Date: 06/27/2023 CLINICAL DATA:  72 year old male with left lower quadrant abdominal pain, postprandial at 2100 hours. Nausea vomiting and diarrhea. History of prostate cancer. EXAM: CT ABDOMEN AND PELVIS WITH CONTRAST TECHNIQUE: Multidetector CT imaging of the abdomen and pelvis was performed using the standard protocol following bolus administration of intravenous contrast. RADIATION DOSE REDUCTION: This exam was performed according to the departmental dose-optimization program which includes automated exposure control, adjustment of the mA and/or kV according to patient size and/or use of iterative reconstruction technique. CONTRAST:   OMNIPAQUE IOHEXOL 300 MG/ML  SOLN COMPARISON:  CT Abdomen and Pelvis 06/05/2022 and earlier. FINDINGS: Lower chest: Lower lung volumes with lung base atelectasis. No pericardial or pleural effusion. Moderate size gastric hiatal hernia is stable. Hepatobiliary: Evidence of hepatic steatosis, but otherwise negative liver and gallbladder. Pancreas: Heterogeneous fatty atrophy of the pancreatic head appears stable from last year, with similar changes noted on 01/26/2010 CT Abdomen and Pelvis. Pancreas within normal limits. Spleen: Negative. Adrenals/Urinary Tract: Normal adrenal glands. Negative right kidney and decompressed right ureter. Inflamed left kidney with hydronephrosis and left lower pole nephrolithiasis measuring 5 mm. Trace left  pararenal space fluid with simple fluid density. Left hydroureter and periureteral stranding, urothelial thickening which continues distally (series 2, image 66) and at the left pelvic inlet there is a relatively large, oblong 6 x 5 mm obstructing ureteral calculus on series 2, image 69. Associated left pelvic side wall inflammation. Distal to the stone the left ureter tapers to the UVJ (coronal image 75). Other pelvic phleboliths. Unremarkable bladder. Stomach/Bowel: Extensive diverticulosis of the large bowel from the transverse colon through the sigmoid. Those segments are mostly decompressed, and no active inflammation is identified. The right colon and cecum appear negative. Appendix not identified, diminutive or absent. Terminal ileum is within normal limits. No dilated small bowel. Chronic gastric hernia. Intra-abdominal stomach and duodenum appear negative. No free air or free fluid. Vascular/Lymphatic: Aortoiliac calcified atherosclerosis. Normal caliber abdominal aorta and the major arterial structures in the abdomen and pelvis, portal venous system appear patent. No lymphadenopathy. Reproductive: Negative; suspect small biopsy or surgical clips within the prostate. Other:  No pelvis free fluid. Left pelvic side wall inflammatory stranding related to the left ureter. Musculoskeletal: No acute or suspicious osseous lesion. IMPRESSION: 1. Acute obstructive uropathy on the left. Trace forniceal rupture. Relatively large obstructing 6 x 5 mm Left Ureteral stone near the pelvic inlet. Associated left left pelvic side wall inflammation. Additional left lower pole nephrolithiasis. 2. No other acute or inflammatory process identified in the abdomen or pelvis. Colonic diverticulosis. Chronic gastric hiatal hernia. Hepatic steatosis. Aortic Atherosclerosis (ICD10-I70.0). Electronically Signed   By: Odessa Fleming M.D.   On: 06/27/2023 04:07    Procedures Procedures    Medications Ordered in ED Medications  sodium chloride 0.9 % bolus 500 mL (0 mLs Intravenous Stopped 06/27/23 0400)  HYDROmorphone (DILAUDID) injection 1 mg (1 mg Intravenous Given 06/27/23 0252)  iohexol (OMNIPAQUE) 300 MG/ML solution 100 mL (100 mLs Intravenous Contrast Given 06/27/23 6045)    ED Course/ Medical Decision Making/ A&P                                 Medical Decision Making Amount and/or Complexity of Data Reviewed Labs: ordered. Decision-making details documented in ED Course. Radiology: ordered and independent interpretation performed. Decision-making details documented in ED Course. ECG/medicine tests: ordered and independent interpretation performed. Decision-making details documented in ED Course.  Risk Prescription drug management.   Differential Diagnosis considered includes, but not limited to: Appendicitis; colitis; diverticulitis; bowel obstruction; cystitis; nephrolithiasis; pyelonephritis.  Patient with left-sided abdominal pain, nausea, vomiting, diarrhea.  Patient with some left lower quadrant tenderness, no guarding or rebound.  He does report a history of diverticulitis.  Patient administered analgesia and has improved, vital signs are normalized, blood pressure now normotensive.   Blood work unremarkable.  Urinalysis with microscopic hematuria, no signs of infection.  CT scan shows proximal left ureteral stone with hydronephrosis and calyceal rupture.  Patient's pain is well-controlled.  Will discharge with Percocet for analgesia.  He is already on Flomax.  Prompt follow-up with urology, given return precautions.        Final Clinical Impression(s) / ED Diagnoses Final diagnoses:  Renal colic on left side    Rx / DC Orders ED Discharge Orders          Ordered    oxyCODONE-acetaminophen (PERCOCET/ROXICET) 5-325 MG tablet  Every 4 hours PRN        06/27/23 0532    oxyCODONE-acetaminophen (PERCOCET) 5-325 MG tablet  Every 4 hours  PRN        06/27/23 0532    Ambulatory referral to Urology        06/27/23 0533              Gilda Crease, MD 06/27/23 [phone]

## 2023-06-30 ENCOUNTER — Telehealth: Payer: Self-pay

## 2023-06-30 ENCOUNTER — Ambulatory Visit (HOSPITAL_COMMUNITY)
Admission: RE | Admit: 2023-06-30 | Discharge: 2023-06-30 | Disposition: A | Discharge status: Home / Self Care | Payer: Medicare PPO | Source: Ambulatory Visit | Attending: Urology | Admitting: Urology

## 2023-06-30 DIAGNOSIS — N200 Calculus of kidney: Secondary | ICD-10-CM | POA: Diagnosis present

## 2023-06-30 MED FILL — Oxycodone w/ Acetaminophen Tab 5-325 MG: ORAL | Qty: 6 | Status: AC

## 2023-06-30 NOTE — Telephone Encounter (Signed)
Patient called office to be seen for recent ER visit for kidney stone. Appointment scheduled with patient and voiced understanding he will need KUB prior

## 2023-07-02 ENCOUNTER — Encounter: Payer: Self-pay | Admitting: Urology

## 2023-07-02 ENCOUNTER — Ambulatory Visit: Payer: Medicare PPO | Admitting: Urology

## 2023-07-02 VITALS — BP 123/79 | HR 53

## 2023-07-02 DIAGNOSIS — N200 Calculus of kidney: Secondary | ICD-10-CM

## 2023-07-02 DIAGNOSIS — N201 Calculus of ureter: Secondary | ICD-10-CM | POA: Diagnosis not present

## 2023-07-02 LAB — URINALYSIS, ROUTINE W REFLEX MICROSCOPIC
Bilirubin, UA: NEGATIVE
Glucose, UA: NEGATIVE
Ketones, UA: NEGATIVE
Leukocytes,UA: NEGATIVE
Nitrite, UA: NEGATIVE
Protein,UA: NEGATIVE
Specific Gravity, UA: 1.025 (ref 1.005–1.030)
Urobilinogen, Ur: 0.2 mg/dL (ref 0.2–1.0)
pH, UA: 6 (ref 5.0–7.5)

## 2023-07-02 LAB — MICROSCOPIC EXAMINATION: Bacteria, UA: NONE SEEN

## 2023-07-02 NOTE — Patient Instructions (Signed)
 ESWL for Kidney Stones  Extracorporeal shock wave lithotripsy (ESWL) is a treatment that can help break up kidney stones that are too large to pass on their own.  This is a nonsurgical procedure that breaks up a kidney stone with shock waves. These shock waves pass through your body and focus on the kidney stone. They cause the kidney stone to break into smaller pieces (fragments) while it is still in the urinary tract. The fragments of stone can pass more easily out of your body in the urine. Tell a health care provider about: Any allergies you have. All medicines you are taking, including vitamins, herbs, eye drops, creams, and over-the-counter medicines. Any problems you or family members have had with anesthetic medicines. Any bleeding problems you have. Any surgeries you have had. Any medical conditions you have. Whether you are pregnant or may be pregnant. What are the risks? Your health care provider will talk with you about risks. These may include: Infection. Bleeding from the kidney. Bruising of the kidney or skin. Scarring of the kidney. This can lead to: Increased blood pressure. Poor kidney function. Return (recurrence) of kidney stones. Damage to other structures or organs. This may include the liver, colon, spleen, or pancreas. Blockage (obstruction) of the tube that carries urine from the kidney to the bladder (ureter). Failure of the kidney stone to break into fragments. What happens before the procedure? When to stop eating and drinking Follow instructions from your health care provider about what you may eat and drink. These may include: 8 hours before your procedure Stop eating most foods. Do not eat meat, fried foods, or fatty foods. Eat only light foods, such as toast or crackers. All liquids are okay except energy drinks and alcohol. 6 hours before your procedure Stop eating. Drink only clear liquids, such as water, clear fruit juice, black coffee, plain tea,  and sports drinks. Do not drink energy drinks or alcohol. 2 hours before your procedure Stop drinking all liquids. You may be allowed to take medicines with small sips of water. If you do not follow your health care provider's instructions, your procedure may be delayed or canceled. Medicines Ask your health care provider about: Changing or stopping your regular medicines. These include any diabetes medicines or blood thinners you take. Taking medicines such as aspirin and ibuprofen. These medicines can thin your blood. Do not take them unless your health care provider tells you to. Taking over-the-counter medicines, vitamins, herbs, and supplements. Tests You may have tests, such as: Blood tests. Urine tests. Imaging tests. This may include a CT scan. Surgery safety Ask your health care provider: How your surgery site will be marked. What steps will be taken to help prevent infection. These steps may include: Washing skin with a soap that kills germs. Receiving antibiotics. General instructions If you will be going home right after the procedure, plan to have a responsible adult: Take you home from the hospital or clinic. You will not be allowed to drive. Care for you for the time you are told. What happens during the procedure?  An IV will be inserted into one of your veins. You may be given: A sedative. This helps you relax. Anesthesia. This will: Numb certain areas of your body. Make you fall asleep for surgery. A water-filled cushion may be placed behind your kidney or on your abdomen. In some cases, you may be placed in a tub of lukewarm water. Your body will be positioned in a way that makes it  easier to target the kidney stone. An X-ray or ultrasound exam will be done to locate your stone. Shock waves will be aimed at the stone. If you are awake, you may feel a tapping sensation as the shock waves pass through your body. A small mesh tube (stent) may be placed in your  ureter. This will help keep urine flowing from the kidney if the fragments of the stone have been blocking the ureter. The stent will be removed at a later time by your health care provider. The procedure may vary among health care providers and hospitals. What happens after the procedure? Your blood pressure, heart rate, breathing rate, and blood oxygen level will be monitored until you leave the hospital or clinic. You may have an X-ray after the procedure to see how many of the kidney stones were broken up. This will also show how much of the stone has passed. If there are still large fragments after treatment, you may need to have a second procedure at a later time. This information is not intended to replace advice given to you by your health care provider. Make sure you discuss any questions you have with your health care provider. Document Revised: 01/17/2023 Document Reviewed: 02/21/2022 Elsevier Patient Education  2024 [employer].

## 2023-07-02 NOTE — Progress Notes (Signed)
Opened in error

## 2023-07-02 NOTE — H&P (View-Only) (Signed)
 07/02/2023 9:13 AM   Walter Harrison 1951-01-31 [id number]  Referring provider: [facility] [address] [place],  [place] [address]  nephrolithiasis   HPI: [name] is a 72yo here for evaluation of nephrolithiasis. He developed left flank pain last Friday and presented to the ER. He was diagnosed with a 6mm left distal ureteral calculus. KUB from 8/26 shows a left distal ureteral calculus. No flank pain currently. No worsening LUTS   PMH: Past Medical History:  Diagnosis Date   BPH (benign prostatic hyperplasia)    Cancer (HCC)    Prostate   Hay fever    Hypertension    Pneumonia    Thalassemia 06/04/2012    Surgical History: Past Surgical History:  Procedure Laterality Date   COLONOSCOPY  2009   Dr. Allena Katz: diverticulosis but h/o colon polyps on prior   COLONOSCOPY N/A 04/07/2014   Procedure: COLONOSCOPY;  Surgeon: Corbin Ade, MD;  [facility];  Service: Endoscopy;  Laterality: N/A;  1:45   COLONOSCOPY N/A 07/21/2019   Procedure: COLONOSCOPY;  Surgeon: Corbin Ade, MD;  [facility];  Service: Endoscopy;  Laterality: N/A;  8:30   COLONOSCOPY WITH PROPOFOL N/A 05/12/2023   Procedure: COLONOSCOPY WITH PROPOFOL;  Surgeon: Corbin Ade, MD;  [facility];  Service: Endoscopy;  Laterality: N/A;  1:15pm, asa 2   ESOPHAGOGASTRODUODENOSCOPY     multiple times by Dr. Allena Katz, 7-8 times. yearly for the hiatal hernia. no barrett's esophagus per patient.  Dr. Allena Katz   ESOPHAGOGASTRODUODENOSCOPY  03/2012   Dr. Allena Katz: [facility], 7cm paraesophageal hernia, gastritis   EXTRACORPOREAL SHOCK WAVE LITHOTRIPSY Left 07/18/2022   Procedure: EXTRACORPOREAL SHOCK WAVE LITHOTRIPSY (ESWL);  Surgeon: Orson Ape, MD;  Location: [facility];  Service: Urology;  Laterality: Left;   HERNIA REPAIR     hiatal hernia repair, [facility]   HOT HEMOSTASIS  05/12/2023   Procedure: HOT HEMOSTASIS (ARGON PLASMA COAGULATION/BICAP);  Surgeon: Corbin Ade, MD;   [facility]: [facility];  Service: Endoscopy;;   TONSILLECTOMY     TRANSURETHRAL RESECTION OF PROSTATE      Home Medications:  Allergies as of 07/02/2023       Reactions   Sulfa Antibiotics Nausea Only        Medication List        Accurate as of July 02, 2023  9:13 AM. If you have any questions, ask your nurse or doctor.          allopurinol 300 MG tablet Commonly known as: ZYLOPRIM Take 300 mg by mouth daily.   aspirin 81 MG chewable tablet Chew 81 mg by mouth daily after breakfast.   chlorthalidone 25 MG tablet Commonly known as: HYGROTON Take 12.5 mg by mouth daily after breakfast.   docusate sodium 100 MG capsule Commonly known as: COLACE Take 100 mg by mouth 2 (two) times daily.   hydrocortisone 2.5 % rectal cream Commonly known as: ANUSOL-HC Place 1 Application rectally 2 (two) times daily. For 2 weeks.   multivitamin with minerals Tabs tablet Take 1 tablet by mouth daily after breakfast. Centrum Silver   oxyCODONE-acetaminophen 5-325 MG tablet Commonly known as: PERCOCET/ROXICET Take 1 tablet by mouth every 4 (four) hours as needed for severe pain.   oxyCODONE-acetaminophen 5-325 MG tablet Commonly known as: Percocet Take 1-2 tablets by mouth every 4 (four) hours as needed.   relugolix 120 MG tablet Commonly known as: ORGOVYX Take 120 mg by mouth daily.  tamsulosin 0.4 MG Caps capsule Commonly known as: FLOMAX Take 1 capsule (0.4 mg total) by mouth 2 (two) times daily after a meal.        Allergies:  Allergies  Allergen Reactions   Sulfa Antibiotics Nausea Only    Family History: Family History  Problem Relation Age of Onset   Cancer - Prostate Father    Stroke Sister    Kidney disease Sister    Prostate cancer Brother        has had bladder cancer, now fighting four battle with cancer   Colon cancer Neg Hx     Social History:  reports that he has never smoked. He has quit using smokeless tobacco. He reports that he does not  drink alcohol and does not use drugs.  ROS: All other review of systems were reviewed and are negative except what is noted above in HPI  Physical Exam: BP 123/79   Pulse (!) 53   Constitutional:  Alert and oriented, No acute distress. HEENT: [place] [employer] [place] AT, moist mucus membranes.  Trachea midline, no masses. Cardiovascular: No clubbing, cyanosis, or edema. Respiratory: Normal respiratory effort, no increased work of breathing. GI: Abdomen is soft, nontender, nondistended, no abdominal masses GU: No CVA tenderness.  Lymph: No cervical or inguinal lymphadenopathy. Skin: No rashes, bruises or suspicious lesions. Neurologic: Grossly intact, no focal deficits, moving all 4 extremities. Psychiatric: Normal mood and affect.  Laboratory Data: Lab Results  Component Value Date   WBC 6.9 06/27/2023   HGB 12.8 (L) 06/27/2023   HCT 38.7 (L) 06/27/2023   MCV 90.2 06/27/2023   PLT 209 06/27/2023    Lab Results  Component Value Date   CREATININE 1.26 (H) 06/27/2023    No results found for: "PSA"  No results found for: "TESTOSTERONE"  No results found for: "HGBA1C"  Urinalysis    Component Value Date/Time   COLORURINE YELLOW 06/27/2023 0438   APPEARANCEUR CLEAR 06/27/2023 0438   APPEARANCEUR Clear 04/18/2023 1146   LABSPEC 1.038 (H) 06/27/2023 0438   PHURINE 5.0 06/27/2023 0438   GLUCOSEU NEGATIVE 06/27/2023 0438   HGBUR LARGE (A) 06/27/2023 0438   BILIRUBINUR NEGATIVE 06/27/2023 0438   BILIRUBINUR Negative 04/18/2023 1146   KETONESUR NEGATIVE 06/27/2023 0438   PROTEINUR NEGATIVE 06/27/2023 0438   NITRITE NEGATIVE 06/27/2023 0438   LEUKOCYTESUR NEGATIVE 06/27/2023 0438    Lab Results  Component Value Date   LABMICR Comment 04/18/2023   BACTERIA NONE SEEN 06/27/2023    Pertinent Imaging: Ct 06/27/2023 and KUb 8/26: Images reviewed and discussed with the patient  Results for orders placed during the hospital encounter of 07/18/22  DG Abd 1 View  Narrative CLINICAL DATA:   Kidney stone  EXAM: ABDOMEN - 1 VIEW  COMPARISON:  CT abdomen done on 06/05/2022  FINDINGS: Bowel gas pattern is nonspecific. There is a 6 mm calcific density in the lower pole of left kidney. Kidneys are partly obscured by bowel contents. No abnormal masses are seen. Phleboliths are seen in pelvis.  IMPRESSION: There is a 6 mm left renal calculus.   Electronically Signed By: Ernie Avena M.D. On: 07/18/2022 12:57  No results found for this or any previous visit.  No results found for this or any previous visit.  No results found for this or any previous visit.  No results found for this or any previous visit.  No valid procedures specified. No results found for this or any previous visit.  No results found for this or any previous visit.  Assessment & Plan:    1. Kidney stones -We discussed the management of kidney stones. These options include observation, ureteroscopy, shockwave lithotripsy (ESWL) and percutaneous nephrolithotomy (PCNL). We discussed which options are relevant to the patient's stone(s). We discussed the natural history of kidney stones as well as the complications of untreated stones and the impact on quality of life without treatment as well as with each of the above listed treatments. We also discussed the efficacy of each treatment in its ability to clear the stone burden. With any of these management options I discussed the signs and symptoms of infection and the need for emergent treatment should these be experienced. For each option we discussed the ability of each procedure to clear the patient of their stone burden.   For observation I described the risks which include but are not limited to silent renal damage, life-threatening infection, need for emergent surgery, failure to pass stone and pain.   For ureteroscopy I described the risks which include bleeding, infection, damage to contiguous structures, positioning injury, ureteral  stricture, ureteral avulsion, ureteral injury, need for prolonged ureteral stent, inability to perform ureteroscopy, need for an interval procedure, inability to clear stone burden, stent discomfort/pain, heart attack, stroke, pulmonary embolus and the inherent risks with general anesthesia.   For shockwave lithotripsy I described the risks which include arrhythmia, kidney contusion, kidney hemorrhage, need for transfusion, pain, inability to adequately break up stone, inability to pass stone fragments, Steinstrasse, infection associated with obstructing stones, need for alternate surgical procedure, need for repeat shockwave lithotripsy, MI, CVA, PE and the inherent risks with anesthesia/conscious sedation.   For PCNL I described the risks including positioning injury, pneumothorax, hydrothorax, need for chest tube, inability to clear stone burden, renal laceration, arterial venous fistula or malformation, need for embolization of kidney, loss of kidney or renal function, need for repeat procedure, need for prolonged nephrostomy tube, ureteral avulsion, MI, CVA, PE and the inherent risks of general anesthesia.   - The patient would like to proceed with  Left ESWL - Urinalysis, Routine w reflex microscopic   No follow-ups on file.  Wilkie Aye, MD  [facility] [place]

## 2023-07-02 NOTE — Progress Notes (Signed)
07/02/2023 9:13 AM   Walter Harrison 1951-01-31 [id number]  Referring provider: [facility] [address] [place],  [place] [address]  nephrolithiasis   HPI: [name] is a 72yo here for evaluation of nephrolithiasis. He developed left flank pain last Friday and presented to the ER. He was diagnosed with a 6mm left distal ureteral calculus. KUB from 8/26 shows a left distal ureteral calculus. No flank pain currently. No worsening LUTS   PMH: Past Medical History:  Diagnosis Date   BPH (benign prostatic hyperplasia)    Cancer (HCC)    Prostate   Hay fever    Hypertension    Pneumonia    Thalassemia 06/04/2012    Surgical History: Past Surgical History:  Procedure Laterality Date   COLONOSCOPY  2009   Dr. Allena Katz: diverticulosis but h/o colon polyps on prior   COLONOSCOPY N/A 04/07/2014   Procedure: COLONOSCOPY;  Surgeon: Corbin Ade, MD;  [facility];  Service: Endoscopy;  Laterality: N/A;  1:45   COLONOSCOPY N/A 07/21/2019   Procedure: COLONOSCOPY;  Surgeon: Corbin Ade, MD;  [facility];  Service: Endoscopy;  Laterality: N/A;  8:30   COLONOSCOPY WITH PROPOFOL N/A 05/12/2023   Procedure: COLONOSCOPY WITH PROPOFOL;  Surgeon: Corbin Ade, MD;  [facility];  Service: Endoscopy;  Laterality: N/A;  1:15pm, asa 2   ESOPHAGOGASTRODUODENOSCOPY     multiple times by Dr. Allena Katz, 7-8 times. yearly for the hiatal hernia. no barrett's esophagus per patient.  Dr. Allena Katz   ESOPHAGOGASTRODUODENOSCOPY  03/2012   Dr. Allena Katz: [facility], 7cm paraesophageal hernia, gastritis   EXTRACORPOREAL SHOCK WAVE LITHOTRIPSY Left 07/18/2022   Procedure: EXTRACORPOREAL SHOCK WAVE LITHOTRIPSY (ESWL);  Surgeon: Orson Ape, MD;  Location: [facility];  Service: Urology;  Laterality: Left;   HERNIA REPAIR     hiatal hernia repair, [facility]   HOT HEMOSTASIS  05/12/2023   Procedure: HOT HEMOSTASIS (ARGON PLASMA COAGULATION/BICAP);  Surgeon: Corbin Ade, MD;   [facility];  Service: Endoscopy;;   TONSILLECTOMY     TRANSURETHRAL RESECTION OF PROSTATE      Home Medications:  Allergies as of 07/02/2023       Reactions   Sulfa Antibiotics Nausea Only        Medication List        Accurate as of July 02, 2023  9:13 AM. If you have any questions, ask your nurse or doctor.          allopurinol 300 MG tablet Commonly known as: ZYLOPRIM Take 300 mg by mouth daily.   aspirin 81 MG chewable tablet Chew 81 mg by mouth daily after breakfast.   chlorthalidone 25 MG tablet Commonly known as: HYGROTON Take 12.5 mg by mouth daily after breakfast.   docusate sodium 100 MG capsule Commonly known as: COLACE Take 100 mg by mouth 2 (two) times daily.   hydrocortisone 2.5 % rectal cream Commonly known as: ANUSOL-HC Place 1 Application rectally 2 (two) times daily. For 2 weeks.   multivitamin with minerals Tabs tablet Take 1 tablet by mouth daily after breakfast. Centrum Silver   oxyCODONE-acetaminophen 5-325 MG tablet Commonly known as: PERCOCET/ROXICET Take 1 tablet by mouth every 4 (four) hours as needed for severe pain.   oxyCODONE-acetaminophen 5-325 MG tablet Commonly known as: Percocet Take 1-2 tablets by mouth every 4 (four) hours as needed.   relugolix 120 MG tablet Commonly known as: ORGOVYX Take 120 mg by mouth daily.  tamsulosin 0.4 MG Caps capsule Commonly known as: FLOMAX Take 1 capsule (0.4 mg total) by mouth 2 (two) times daily after a meal.        Allergies:  Allergies  Allergen Reactions   Sulfa Antibiotics Nausea Only    Family History: Family History  Problem Relation Age of Onset   Cancer - Prostate Father    Stroke Sister    Kidney disease Sister    Prostate cancer Brother        has had bladder cancer, now fighting four battle with cancer   Colon cancer Neg Hx     Social History:  reports that he has never smoked. He has quit using smokeless tobacco. He reports that he does not  drink alcohol and does not use drugs.  ROS: All other review of systems were reviewed and are negative except what is noted above in HPI  Physical Exam: BP 123/79   Pulse (!) 53   Constitutional:  Alert and oriented, No acute distress. HEENT: [place] [employer] [place] AT, moist mucus membranes.  Trachea midline, no masses. Cardiovascular: No clubbing, cyanosis, or edema. Respiratory: Normal respiratory effort, no increased work of breathing. GI: Abdomen is soft, nontender, nondistended, no abdominal masses GU: No CVA tenderness.  Lymph: No cervical or inguinal lymphadenopathy. Skin: No rashes, bruises or suspicious lesions. Neurologic: Grossly intact, no focal deficits, moving all 4 extremities. Psychiatric: Normal mood and affect.  Laboratory Data: Lab Results  Component Value Date   WBC 6.9 06/27/2023   HGB 12.8 (L) 06/27/2023   HCT 38.7 (L) 06/27/2023   MCV 90.2 06/27/2023   PLT 209 06/27/2023    Lab Results  Component Value Date   CREATININE 1.26 (H) 06/27/2023    No results found for: "PSA"  No results found for: "TESTOSTERONE"  No results found for: "HGBA1C"  Urinalysis    Component Value Date/Time   COLORURINE YELLOW 06/27/2023 0438   APPEARANCEUR CLEAR 06/27/2023 0438   APPEARANCEUR Clear 04/18/2023 1146   LABSPEC 1.038 (H) 06/27/2023 0438   PHURINE 5.0 06/27/2023 0438   GLUCOSEU NEGATIVE 06/27/2023 0438   HGBUR LARGE (A) 06/27/2023 0438   BILIRUBINUR NEGATIVE 06/27/2023 0438   BILIRUBINUR Negative 04/18/2023 1146   KETONESUR NEGATIVE 06/27/2023 0438   PROTEINUR NEGATIVE 06/27/2023 0438   NITRITE NEGATIVE 06/27/2023 0438   LEUKOCYTESUR NEGATIVE 06/27/2023 0438    Lab Results  Component Value Date   LABMICR Comment 04/18/2023   BACTERIA NONE SEEN 06/27/2023    Pertinent Imaging: Ct 06/27/2023 and KUb 8/26: Images reviewed and discussed with the patient  Results for orders placed during the hospital encounter of 07/18/22  DG Abd 1 View  Narrative CLINICAL DATA:   Kidney stone  EXAM: ABDOMEN - 1 VIEW  COMPARISON:  CT abdomen done on 06/05/2022  FINDINGS: Bowel gas pattern is nonspecific. There is a 6 mm calcific density in the lower pole of left kidney. Kidneys are partly obscured by bowel contents. No abnormal masses are seen. Phleboliths are seen in pelvis.  IMPRESSION: There is a 6 mm left renal calculus.   Electronically Signed By: Ernie Avena M.D. On: 07/18/2022 12:57  No results found for this or any previous visit.  No results found for this or any previous visit.  No results found for this or any previous visit.  No results found for this or any previous visit.  No valid procedures specified. No results found for this or any previous visit.  No results found for this or any previous visit.  Assessment & Plan:    1. Kidney stones -We discussed the management of kidney stones. These options include observation, ureteroscopy, shockwave lithotripsy (ESWL) and percutaneous nephrolithotomy (PCNL). We discussed which options are relevant to the patient's stone(s). We discussed the natural history of kidney stones as well as the complications of untreated stones and the impact on quality of life without treatment as well as with each of the above listed treatments. We also discussed the efficacy of each treatment in its ability to clear the stone burden. With any of these management options I discussed the signs and symptoms of infection and the need for emergent treatment should these be experienced. For each option we discussed the ability of each procedure to clear the patient of their stone burden.   For observation I described the risks which include but are not limited to silent renal damage, life-threatening infection, need for emergent surgery, failure to pass stone and pain.   For ureteroscopy I described the risks which include bleeding, infection, damage to contiguous structures, positioning injury, ureteral  stricture, ureteral avulsion, ureteral injury, need for prolonged ureteral stent, inability to perform ureteroscopy, need for an interval procedure, inability to clear stone burden, stent discomfort/pain, heart attack, stroke, pulmonary embolus and the inherent risks with general anesthesia.   For shockwave lithotripsy I described the risks which include arrhythmia, kidney contusion, kidney hemorrhage, need for transfusion, pain, inability to adequately break up stone, inability to pass stone fragments, Steinstrasse, infection associated with obstructing stones, need for alternate surgical procedure, need for repeat shockwave lithotripsy, MI, CVA, PE and the inherent risks with anesthesia/conscious sedation.   For PCNL I described the risks including positioning injury, pneumothorax, hydrothorax, need for chest tube, inability to clear stone burden, renal laceration, arterial venous fistula or malformation, need for embolization of kidney, loss of kidney or renal function, need for repeat procedure, need for prolonged nephrostomy tube, ureteral avulsion, MI, CVA, PE and the inherent risks of general anesthesia.   - The patient would like to proceed with  Left ESWL - Urinalysis, Routine w reflex microscopic   No follow-ups on file.  Wilkie Aye, MD  [facility] [place]

## 2023-07-04 ENCOUNTER — Other Ambulatory Visit: Payer: Self-pay

## 2023-07-04 ENCOUNTER — Encounter (HOSPITAL_COMMUNITY)
Admission: RE | Admit: 2023-07-04 | Discharge: 2023-07-04 | Disposition: A | Discharge status: Home / Self Care | Payer: Medicare PPO | Source: Ambulatory Visit | Attending: Urology | Admitting: Urology

## 2023-07-04 ENCOUNTER — Encounter (HOSPITAL_COMMUNITY): Payer: Self-pay

## 2023-07-04 DIAGNOSIS — N200 Calculus of kidney: Secondary | ICD-10-CM

## 2023-07-08 ENCOUNTER — Ambulatory Visit (HOSPITAL_COMMUNITY)
Admission: RE | Admit: 2023-07-08 | Discharge: 2023-07-08 | Disposition: A | Discharge status: Home / Self Care | Payer: Medicare PPO | Attending: Urology | Admitting: Urology

## 2023-07-08 ENCOUNTER — Other Ambulatory Visit: Payer: Self-pay

## 2023-07-08 ENCOUNTER — Encounter (HOSPITAL_COMMUNITY): Payer: Self-pay | Admitting: Urology

## 2023-07-08 ENCOUNTER — Ambulatory Visit (HOSPITAL_COMMUNITY): Payer: Medicare PPO

## 2023-07-08 ENCOUNTER — Encounter (HOSPITAL_COMMUNITY)
Admission: RE | Disposition: A | Discharge status: Home / Self Care | Payer: Self-pay | Source: Home / Self Care | Attending: Urology

## 2023-07-08 DIAGNOSIS — N202 Calculus of kidney with calculus of ureter: Secondary | ICD-10-CM | POA: Diagnosis present

## 2023-07-08 DIAGNOSIS — N201 Calculus of ureter: Secondary | ICD-10-CM

## 2023-07-08 HISTORY — PX: EXTRACORPOREAL SHOCK WAVE LITHOTRIPSY: SHX1557

## 2023-07-08 SURGERY — LITHOTRIPSY, ESWL
Anesthesia: LOCAL | Laterality: Left

## 2023-07-08 MED ORDER — OXYCODONE-ACETAMINOPHEN 5-325 MG PO TABS
1.0000 | ORAL_TABLET | ORAL | 0 refills | Status: DC | PRN
Start: 2023-07-08 — End: 2023-08-11

## 2023-07-08 MED ORDER — DIAZEPAM 5 MG PO TABS
10.0000 mg | ORAL_TABLET | Freq: Once | ORAL | Status: AC
  Administered 2023-07-08: 10 mg via ORAL
  Filled 2023-07-08: qty 2

## 2023-07-08 MED ORDER — DIPHENHYDRAMINE HCL 25 MG PO CAPS
25.0000 mg | ORAL_CAPSULE | ORAL | Status: AC
  Administered 2023-07-08: 25 mg via ORAL
  Filled 2023-07-08: qty 1

## 2023-07-08 MED ORDER — ONDANSETRON HCL 4 MG PO TABS: 4.0000 mg | ORAL_TABLET | Freq: Every day | ORAL | 1 refills | Status: DC | PRN

## 2023-07-08 MED ORDER — TAMSULOSIN HCL 0.4 MG PO CAPS: 0.4000 mg | ORAL_CAPSULE | Freq: Two times a day (BID) | ORAL | 3 refills | Status: DC

## 2023-07-08 MED ORDER — SODIUM CHLORIDE 0.9 % IV SOLN: INTRAVENOUS | Status: DC

## 2023-07-08 NOTE — Interval H&P Note (Signed)
History and Physical Interval Note:  07/08/2023 8:37 AM  Walter Harrison  has presented today for surgery, with the diagnosis of Left Ureteral Stone.  The various methods of treatment have been discussed with the patient and family. After consideration of risks, benefits and other options for treatment, the patient has consented to  Procedure(s): EXTRACORPOREAL SHOCK WAVE LITHOTRIPSY (ESWL) (Left) as a surgical intervention.  The patient's history has been reviewed, patient examined, no change in status, stable for surgery.  I have reviewed the patient's chart and labs.  Questions were answered to the patient's satisfaction.     Wilkie Aye

## 2023-07-09 ENCOUNTER — Encounter (HOSPITAL_COMMUNITY): Payer: Self-pay | Admitting: Urology

## 2023-07-11 ENCOUNTER — Other Ambulatory Visit: Payer: Medicare PPO

## 2023-07-11 DIAGNOSIS — C61 Malignant neoplasm of prostate: Secondary | ICD-10-CM

## 2023-07-12 LAB — PSA: Prostate Specific Ag, Serum: <0.1 ng/mL (ref 0.0–4.0)

## 2023-07-18 ENCOUNTER — Ambulatory Visit: Payer: Medicare PPO | Admitting: Urology

## 2023-07-18 ENCOUNTER — Encounter: Payer: Self-pay | Admitting: Urology

## 2023-07-18 VITALS — BP 112/69 | HR 65

## 2023-07-18 DIAGNOSIS — C61 Malignant neoplasm of prostate: Secondary | ICD-10-CM

## 2023-07-18 DIAGNOSIS — N200 Calculus of kidney: Secondary | ICD-10-CM

## 2023-07-18 DIAGNOSIS — Z87442 Personal history of urinary calculi: Secondary | ICD-10-CM | POA: Diagnosis not present

## 2023-07-18 DIAGNOSIS — R351 Nocturia: Secondary | ICD-10-CM

## 2023-07-18 DIAGNOSIS — N401 Enlarged prostate with lower urinary tract symptoms: Secondary | ICD-10-CM

## 2023-07-18 LAB — URINALYSIS, ROUTINE W REFLEX MICROSCOPIC
Bilirubin, UA: NEGATIVE
Glucose, UA: NEGATIVE
Ketones, UA: NEGATIVE
Leukocytes,UA: NEGATIVE
Nitrite, UA: NEGATIVE
Protein,UA: NEGATIVE
Specific Gravity, UA: 1.02 (ref 1.005–1.030)
Urobilinogen, Ur: 0.2 mg/dL (ref 0.2–1.0)
pH, UA: 6 (ref 5.0–7.5)

## 2023-07-18 LAB — MICROSCOPIC EXAMINATION: Bacteria, UA: NONE SEEN

## 2023-07-18 NOTE — Patient Instructions (Signed)

## 2023-07-18 NOTE — Progress Notes (Unsigned)
07/18/2023 11:53 AM   Walter Harrison 1951/02/05 [id number]  Referring provider: [facility] [address] [place],  [place] [address]  Followup prostate cancer and nephrolithiasis   HPI: PSA remains undetectable. He completed ADT in August 2024. He passed multiple fragments after his ESWL. IPSS 7 QOL 2 on flomax 0.4mg  BID. Uirne stream strong. Nocturia 2-3x.    PMH: Past Medical History:  Diagnosis Date   BPH (benign prostatic hyperplasia)    Cancer (HCC)    Prostate   Hay fever    Hypertension    Pneumonia    Thalassemia 06/04/2012    Surgical History: Past Surgical History:  Procedure Laterality Date   COLONOSCOPY  2009   Dr. Allena Katz: diverticulosis but h/o colon polyps on prior   COLONOSCOPY N/A 04/07/2014   Procedure: COLONOSCOPY;  Surgeon: Corbin Ade, MD;  [facility];  Service: Endoscopy;  Laterality: N/A;  1:45   COLONOSCOPY N/A 07/21/2019   Procedure: COLONOSCOPY;  Surgeon: Corbin Ade, MD;  [facility];  Service: Endoscopy;  Laterality: N/A;  8:30   COLONOSCOPY WITH PROPOFOL N/A 05/12/2023   Procedure: COLONOSCOPY WITH PROPOFOL;  Surgeon: Corbin Ade, MD;  [facility];  Service: Endoscopy;  Laterality: N/A;  1:15pm, asa 2   ESOPHAGOGASTRODUODENOSCOPY     multiple times by Dr. Allena Katz, 7-8 times. yearly for the hiatal hernia. no barrett's esophagus per patient.  Dr. Allena Katz   ESOPHAGOGASTRODUODENOSCOPY  03/2012   Dr. Allena Katz: [facility], 7cm paraesophageal hernia, gastritis   EXTRACORPOREAL SHOCK WAVE LITHOTRIPSY Left 07/18/2022   Procedure: EXTRACORPOREAL SHOCK WAVE LITHOTRIPSY (ESWL);  Surgeon: Orson Ape, MD;  Location: [facility];  Service: Urology;  Laterality: Left;   EXTRACORPOREAL SHOCK WAVE LITHOTRIPSY Left 07/08/2023   Procedure: EXTRACORPOREAL SHOCK WAVE LITHOTRIPSY (ESWL);  Surgeon: Malen Gauze, MD;  Location: [facility];  Service: Urology;  Laterality: Left;   HERNIA REPAIR     hiatal hernia  repair, [facility]   HOT HEMOSTASIS  05/12/2023   Procedure: HOT HEMOSTASIS (ARGON PLASMA COAGULATION/BICAP);  Surgeon: Corbin Ade, MD;  [facility];  Service: Endoscopy;;   TONSILLECTOMY     TRANSURETHRAL RESECTION OF PROSTATE      Home Medications:  Allergies as of 07/18/2023       Reactions   Sulfa Antibiotics Nausea Only        Medication List        Accurate as of July 18, 2023 11:53 AM. If you have any questions, ask your nurse or doctor.          allopurinol 300 MG tablet Commonly known as: ZYLOPRIM Take 300 mg by mouth daily.   aspirin 81 MG chewable tablet Chew 81 mg by mouth daily after breakfast.   chlorthalidone 25 MG tablet Commonly known as: HYGROTON Take 12.5 mg by mouth daily after breakfast.   docusate sodium 100 MG capsule Commonly known as: COLACE Take 100 mg by mouth 2 (two) times daily.   hydrocortisone 2.5 % rectal cream Commonly known as: ANUSOL-HC Place 1 Application rectally 2 (two) times daily. For 2 weeks.   multivitamin with minerals Tabs tablet Take 1 tablet by mouth daily after breakfast. Centrum Silver   ondansetron 4 MG tablet Commonly known as: Zofran Take 1 tablet (4 mg total) by mouth daily as needed for nausea or vomiting.   oxyCODONE-acetaminophen 5-325 MG tablet Commonly known as: PERCOCET/ROXICET Take 1 tablet by mouth every 4 (four) hours as  needed for severe pain.   oxyCODONE-acetaminophen 5-325 MG tablet Commonly known as: Percocet Take 1-2 tablets by mouth every 4 (four) hours as needed.   relugolix 120 MG tablet Commonly known as: ORGOVYX Take 120 mg by mouth daily.   tamsulosin 0.4 MG Caps capsule Commonly known as: FLOMAX Take 1 capsule (0.4 mg total) by mouth 2 (two) times daily after a meal.        Allergies:  Allergies  Allergen Reactions   Sulfa Antibiotics Nausea Only    Family History: Family History  Problem Relation Age of Onset   Cancer - Prostate Father    Stroke  Sister    Kidney disease Sister    Prostate cancer Brother        has had bladder cancer, now fighting four battle with cancer   Colon cancer Neg Hx     Social History:  reports that he has never smoked. He has quit using smokeless tobacco. He reports that he does not drink alcohol and does not use drugs.  ROS: All other review of systems were reviewed and are negative except what is noted above in HPI  Physical Exam: BP 112/69   Pulse 65   Constitutional:  Alert and oriented, No acute distress. HEENT: [place] AT, moist mucus membranes.  Trachea midline, no masses. Cardiovascular: No clubbing, cyanosis, or edema. Respiratory: Normal respiratory effort, no increased work of breathing. GI: Abdomen is soft, nontender, nondistended, no abdominal masses GU: No CVA tenderness.  Lymph: No cervical or inguinal lymphadenopathy. Skin: No rashes, bruises or suspicious lesions. Neurologic: Grossly intact, no focal deficits, moving all 4 extremities. Psychiatric: Normal mood and affect.  Laboratory Data: Lab Results  Component Value Date   WBC 6.9 06/27/2023   HGB 12.8 (L) 06/27/2023   HCT 38.7 (L) 06/27/2023   MCV 90.2 06/27/2023   PLT 209 06/27/2023    Lab Results  Component Value Date   CREATININE 1.26 (H) 06/27/2023    No results found for: "PSA"  No results found for: "TESTOSTERONE"  No results found for: "HGBA1C"  Urinalysis    Component Value Date/Time   COLORURINE YELLOW 06/27/2023 0438   APPEARANCEUR Clear 07/02/2023 0838   LABSPEC 1.038 (H) 06/27/2023 0438   PHURINE 5.0 06/27/2023 0438   GLUCOSEU Negative 07/02/2023 0838   HGBUR LARGE (A) 06/27/2023 0438   BILIRUBINUR Negative 07/02/2023 0838   KETONESUR NEGATIVE 06/27/2023 0438   PROTEINUR Negative 07/02/2023 0838   PROTEINUR NEGATIVE 06/27/2023 0438   NITRITE Negative 07/02/2023 0838   NITRITE NEGATIVE 06/27/2023 0438   LEUKOCYTESUR Negative 07/02/2023 0838   LEUKOCYTESUR NEGATIVE 06/27/2023 0438    Lab  Results  Component Value Date   LABMICR See below: 07/02/2023   WBCUA 0-5 07/02/2023   LABEPIT 0-10 07/02/2023   BACTERIA None seen 07/02/2023    Pertinent Imaging: *** Results for orders placed during the hospital encounter of 07/08/23  DG Abd 1 View  Narrative CLINICAL DATA:  Nephrolithiasis.  Pre lithotripsy.  EXAM: ABDOMEN - 1 VIEW  COMPARISON:  Radiograph 06/30/2023.  CT 06/27/2023  FINDINGS: 6 mm calcification in the left pelvis was not seen on prior radiograph and may represent distal ureteric stone. There additional left pelvic phleboliths. The known left intrarenal stone on prior CT is potentially but not definitively visualized. Normal bowel gas pattern without obstruction.  IMPRESSION: Possible distal left ureteric stone. Known left intrarenal stone is faintly but not definitively seen by radiograph   Electronically Signed By: Narda Rutherford M.D. On:  07/08/2023 10:19  No results found for this or any previous visit.  No results found for this or any previous visit.  No results found for this or any previous visit.  No results found for this or any previous visit.  No valid procedures specified. No results found for this or any previous visit.  No results found for this or any previous visit.   Assessment & Plan:    1. Prostate cancer (HCC) *** - Urinalysis, Routine w reflex microscopic  2. Kidney stones ***   No follow-ups on file.  Wilkie Aye, MD  [facility] [place]

## 2023-07-29 ENCOUNTER — Encounter: Payer: Medicare PPO | Admitting: Urology

## 2023-08-11 ENCOUNTER — Encounter: Payer: Self-pay | Admitting: Gastroenterology

## 2023-08-11 ENCOUNTER — Ambulatory Visit: Payer: Medicare PPO | Admitting: Gastroenterology

## 2023-08-11 VITALS — BP 131/73 | HR 81 | Temp 97.8°F | Ht 68.0 in | Wt 185.4 lb

## 2023-08-11 DIAGNOSIS — K625 Hemorrhage of anus and rectum: Secondary | ICD-10-CM

## 2023-08-11 DIAGNOSIS — Y842 Radiological procedure and radiotherapy as the cause of abnormal reaction of the patient, or of later complication, without mention of misadventure at the time of the procedure: Secondary | ICD-10-CM

## 2023-08-11 DIAGNOSIS — K219 Gastro-esophageal reflux disease without esophagitis: Secondary | ICD-10-CM | POA: Diagnosis not present

## 2023-08-11 DIAGNOSIS — Z860101 Personal history of adenomatous and serrated colon polyps: Secondary | ICD-10-CM

## 2023-08-11 DIAGNOSIS — K627 Radiation proctitis: Secondary | ICD-10-CM | POA: Diagnosis not present

## 2023-08-11 NOTE — Patient Instructions (Signed)
Continue to monitor for blood on the tissue or in the stool. If you start seeing persistently, then you may need additional treatment of the radiation induced spots in the rectum. Please let us know. For acid reflux, take TUMs as per package label or Pepcid 20mg  daily as needed. If reflux symptoms because more regular, let us know. Return office visit as needed.

## 2023-08-11 NOTE — Progress Notes (Signed)
GI Office Note    Referring Provider: [facility] Primary Care Physician:  [facility]  Primary Gastroenterologist: Roetta Sessions, MD   Chief Complaint   Chief Complaint  Patient presents with   Follow-up    States that he noticed blood in his stool and on the tissue this past Friday and yesterday. States that it was bright red on the tissue and yellowish in the stool.    History of Present Illness   Walter Harrison is a 72 y.o. male presenting today for follow up. Last seen 04/2023. Seen at that time for rectal bleeding, irregular stools. H/o prostate cancer s/p radiation, completed in 10/2022, without any rectal bleeding during treatment.  Recent colonoscopy showed radiation proctitis, s/p APC treatment. Patient doing better. For a few days after APC, bleeding had stopped. Saw some brbpr on tissue this weekend for the first time. In the past, seems to happen more after passing a lot of gas and stool more forceful. Overall BMs better on Benefiber 2 tsp daily. Stool consistency improved. Still sometimes firm to loose stool. No melena. Stools 2-3 times per day. Appetite good. No n/v. Recently noted some heartburn symptoms, especially with certain foods. History of remote hiatal hernia repair. On CT last year he had moderate sized hiatal hernia.   CT abdomen pelvis with and without contrast August 2023 showed moderate sized hiatal hernia, pancolonic diverticulosis, heterogeneous nodular enlargement of the prostate gland poorly evaluated on CT but similar appearance to MRI dated 2022 likely reflecting sequela of prior thermotherapy, nonobstructive left lower pole renal stone.  Bone scan with no evidence of metastatic disease.    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     aspirin 81 MG chewable tablet Chew 81 mg by mouth daily after breakfast.     chlorthalidone (HYGROTON) 25 MG tablet Take 12.5 mg by mouth daily after breakfast.      Multiple Vitamin (MULTIVITAMIN WITH MINERALS) TABS tablet Take 1 tablet by mouth daily after breakfast. Centrum Silver     tamsulosin (FLOMAX) 0.4 MG CAPS capsule Take 1 capsule (0.4 mg total) by mouth 2 (two) times daily after a meal. 180 capsule 3   No current facility-administered medications for this visit.    Allergies   Allergies as of 08/11/2023 - Review Complete 08/11/2023  Allergen Reaction Noted   Sulfa antibiotics Nausea Only 01/18/2013       Review of Systems   General: Negative for anorexia, weight loss, fever, chills, fatigue, weakness. ENT: Negative for hoarseness, difficulty swallowing , nasal congestion. CV: Negative for chest pain, angina, palpitations, dyspnea on exertion, peripheral edema.  Respiratory: Negative for dyspnea at rest, dyspnea on exertion, cough, sputum, wheezing.  GI: See history of present illness. GU:  Negative for dysuria, hematuria, urinary incontinence, urinary frequency, nocturnal urination.  Endo: Negative for unusual weight change.     Physical Exam   BP 131/73 (BP Location: Right Arm, Patient Position: Sitting, Cuff Size: Large)   Pulse 81   Temp 97.8 F (36.6 C) (Oral)   Ht 5\' 8"  (1.727 m)   Wt 185 lb 6.4 oz (84.1 kg)  SpO2 95%   BMI 28.19 kg/m    General: Well-nourished, well-developed in no acute distress.  Eyes: No icterus. Mouth: Oropharyngeal mucosa moist and pink   Abdomen: Bowel sounds are normal, nontender, nondistended, no hepatosplenomegaly or masses,  no abdominal bruits or hernia , no rebound or guarding.  Rectal: not performed  Extremities: No lower extremity edema.  No clubbing or deformities. Neuro: Alert and oriented x 4   Skin: Warm and dry, no jaundice.   Psych: Alert and cooperative, normal mood and affect.  Labs   Lab Results  Component Value Date   NA 140 06/27/2023   CL 105 06/27/2023   K 3.4 (L) 06/27/2023   CO2 25 06/27/2023   BUN 15 06/27/2023   CREATININE 1.26 (H) 06/27/2023   GFRNONAA >60 06/27/2023   CALCIUM 9.6 06/27/2023   ALBUMIN 4.0 06/27/2023   GLUCOSE 156 (H) 06/27/2023   Lab Results  Component Value Date   WBC 6.9 06/27/2023   HGB 12.8 (L) 06/27/2023   HCT 38.7 (L) 06/27/2023   MCV 90.2 06/27/2023   PLT 209 06/27/2023   Lab Results  Component Value Date   LIPASE 37 06/27/2023   Lab Results  Component Value Date   ALT 36 06/27/2023   AST 31 06/27/2023   ALKPHOS 72 06/27/2023   BILITOT 0.6 06/27/2023    Imaging Studies   No results found.  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 Recently saw brbpr on toilet tissue this weekend -continue to monitor for bleeding. If becomes more frequent, then may need to consider repeat APC, patient aware to contact us if needed -return ov prn   *H/O adenomatous colon polyps -next colonoscopy 05/2030    Leanna Battles. Melvyn Neth, MHS, PA-C [facility]

## 2023-10-09 ENCOUNTER — Other Ambulatory Visit: Payer: Medicare PPO

## 2023-10-09 DIAGNOSIS — C61 Malignant neoplasm of prostate: Secondary | ICD-10-CM

## 2023-10-10 LAB — PSA: Prostate Specific Ag, Serum: <0.1 ng/mL (ref 0.0–4.0)

## 2023-10-17 ENCOUNTER — Ambulatory Visit: Payer: Medicare PPO | Admitting: Urology

## 2023-10-17 VITALS — BP 116/71 | HR 62

## 2023-10-17 DIAGNOSIS — N138 Other obstructive and reflux uropathy: Secondary | ICD-10-CM

## 2023-10-17 DIAGNOSIS — N200 Calculus of kidney: Secondary | ICD-10-CM | POA: Diagnosis not present

## 2023-10-17 DIAGNOSIS — N401 Enlarged prostate with lower urinary tract symptoms: Secondary | ICD-10-CM | POA: Diagnosis not present

## 2023-10-17 DIAGNOSIS — C61 Malignant neoplasm of prostate: Secondary | ICD-10-CM

## 2023-10-17 DIAGNOSIS — R351 Nocturia: Secondary | ICD-10-CM | POA: Diagnosis not present

## 2023-10-17 LAB — URINALYSIS, ROUTINE W REFLEX MICROSCOPIC
Bilirubin, UA: NEGATIVE
Glucose, UA: NEGATIVE
Ketones, UA: NEGATIVE
Leukocytes,UA: NEGATIVE
Nitrite, UA: NEGATIVE
Protein,UA: NEGATIVE
RBC, UA: NEGATIVE
Specific Gravity, UA: 1.02 (ref 1.005–1.030)
Urobilinogen, Ur: 0.2 mg/dL (ref 0.2–1.0)
pH, UA: 6 (ref 5.0–7.5)

## 2023-10-17 MED ORDER — TAMSULOSIN HCL 0.4 MG PO CAPS: 0.4000 mg | ORAL_CAPSULE | Freq: Two times a day (BID) | ORAL | 3 refills | Status: DC

## 2023-10-17 NOTE — Progress Notes (Unsigned)
10/17/2023 12:12 PM   Walter Harrison January 30, 1951 [id number]  Referring provider: [facility] [address] [place],  [place] [address]  Followup prostate cancer and nephrolithiasis   HPI: Walter Harrison is a 72yo here for followup for prostate cancer , BPh and nephrolithiasis. IPSS 7 QOL 3 on flomax 0.4mg  BID. Nocturia 3x. Urine stream strong. No straining to urinate. PSA undetectable. No stone events since last.    PMH: Past Medical History:  Diagnosis Date   BPH (benign prostatic hyperplasia)    Cancer (HCC)    Prostate   Hay fever    Hypertension    Pneumonia    Thalassemia 06/04/2012    Surgical History: Past Surgical History:  Procedure Laterality Date   COLONOSCOPY  2009   Dr. Allena Katz: diverticulosis but h/o colon polyps on prior   COLONOSCOPY N/A 04/07/2014   Procedure: COLONOSCOPY;  Surgeon: Corbin Ade, MD;  [facility];  Service: Endoscopy;  Laterality: N/A;  1:45   COLONOSCOPY N/A 07/21/2019   Procedure: COLONOSCOPY;  Surgeon: Corbin Ade, MD;  [facility];  Service: Endoscopy;  Laterality: N/A;  8:30   COLONOSCOPY WITH PROPOFOL N/A 05/12/2023   Procedure: COLONOSCOPY WITH PROPOFOL;  Surgeon: Corbin Ade, MD;  [facility];  Service: Endoscopy;  Laterality: N/A;  1:15pm, asa 2   ESOPHAGOGASTRODUODENOSCOPY     multiple times by Dr. Allena Katz, 7-8 times. yearly for the hiatal hernia. no barrett's esophagus per patient.  Dr. Allena Katz   ESOPHAGOGASTRODUODENOSCOPY  03/2012   Dr. Allena Katz: [facility], 7cm paraesophageal hernia, gastritis   EXTRACORPOREAL SHOCK WAVE LITHOTRIPSY Left 07/18/2022   Procedure: EXTRACORPOREAL SHOCK WAVE LITHOTRIPSY (ESWL);  Surgeon: Orson Ape, MD;  Location: [facility];  Service: Urology;  Laterality: Left;   EXTRACORPOREAL SHOCK WAVE LITHOTRIPSY Left 07/08/2023   Procedure: EXTRACORPOREAL SHOCK WAVE LITHOTRIPSY (ESWL);  Surgeon: Malen Gauze, MD;  Location: [facility];  Service: Urology;  Laterality:  Left;   HERNIA REPAIR     hiatal hernia repair, [facility]   HOT HEMOSTASIS  05/12/2023   Procedure: HOT HEMOSTASIS (ARGON PLASMA COAGULATION/BICAP);  Surgeon: Corbin Ade, MD;  [facility];  Service: Endoscopy;;   TONSILLECTOMY     TRANSURETHRAL RESECTION OF PROSTATE      Home Medications:  Allergies as of 10/17/2023       Reactions   Sulfa Antibiotics Nausea Only        Medication List        Accurate as of October 17, 2023 12:12 PM. If you have any questions, ask your nurse or doctor.          STOP taking these medications    BENEFIBER DRINK MIX PO       TAKE these medications    allopurinol 300 MG tablet Commonly known as: ZYLOPRIM Take 300 mg by mouth daily.   aspirin 81 MG chewable tablet Chew 81 mg by mouth daily after breakfast.   chlorthalidone 25 MG tablet Commonly known as: HYGROTON Take 12.5 mg by mouth daily after breakfast.   multivitamin with minerals Tabs tablet Take 1 tablet by mouth daily after breakfast. Centrum Silver   tamsulosin 0.4 MG Caps capsule Commonly known as: FLOMAX Take 1 capsule (0.4 mg total) by mouth 2 (two) times daily after a meal.        Allergies:  Allergies  Allergen Reactions   Sulfa Antibiotics Nausea Only    Family History: Family History  Problem  Relation Age of Onset   Cancer - Prostate Father    Stroke Sister    Kidney disease Sister    Prostate cancer Brother        has had bladder cancer, now fighting four battle with cancer   Colon cancer Neg Hx     Social History:  reports that he has never smoked. He has quit using smokeless tobacco. He reports that he does not drink alcohol and does not use drugs.  ROS: All other review of systems were reviewed and are negative except what is noted above in HPI  Physical Exam: BP 116/71   Pulse 62   Constitutional:  Alert and oriented, No acute distress. HEENT: [place] AT, moist mucus membranes.  Trachea midline, no masses. Cardiovascular: No  clubbing, cyanosis, or edema. Respiratory: Normal respiratory effort, no increased work of breathing. GI: Abdomen is soft, nontender, nondistended, no abdominal masses GU: No CVA tenderness.  Lymph: No cervical or inguinal lymphadenopathy. Skin: No rashes, bruises or suspicious lesions. Neurologic: Grossly intact, no focal deficits, moving all 4 extremities. Psychiatric: Normal mood and affect.  Laboratory Data: Lab Results  Component Value Date   WBC 6.9 06/27/2023   HGB 12.8 (L) 06/27/2023   HCT 38.7 (L) 06/27/2023   MCV 90.2 06/27/2023   PLT 209 06/27/2023    Lab Results  Component Value Date   CREATININE 1.26 (H) 06/27/2023    No results found for: "PSA"  No results found for: "TESTOSTERONE"  No results found for: "HGBA1C"  Urinalysis    Component Value Date/Time   COLORURINE YELLOW 06/27/2023 0438   APPEARANCEUR Clear 07/18/2023 1123   LABSPEC 1.038 (H) 06/27/2023 0438   PHURINE 5.0 06/27/2023 0438   GLUCOSEU Negative 07/18/2023 1123   HGBUR LARGE (A) 06/27/2023 0438   BILIRUBINUR Negative 07/18/2023 1123   KETONESUR NEGATIVE 06/27/2023 0438   PROTEINUR Negative 07/18/2023 1123   PROTEINUR NEGATIVE 06/27/2023 0438   NITRITE Negative 07/18/2023 1123   NITRITE NEGATIVE 06/27/2023 0438   LEUKOCYTESUR Negative 07/18/2023 1123   LEUKOCYTESUR NEGATIVE 06/27/2023 0438    Lab Results  Component Value Date   LABMICR See below: 07/18/2023   WBCUA 0-5 07/18/2023   LABEPIT 0-10 07/18/2023   BACTERIA None seen 07/18/2023    Pertinent Imaging: *** Results for orders placed during the hospital encounter of 07/08/23  DG Abd 1 View  Narrative CLINICAL DATA:  Nephrolithiasis.  Pre lithotripsy.  EXAM: ABDOMEN - 1 VIEW  COMPARISON:  Radiograph 06/30/2023.  CT 06/27/2023  FINDINGS: 6 mm calcification in the left pelvis was not seen on prior radiograph and may represent distal ureteric stone. There additional left pelvic phleboliths. The known left intrarenal  stone on prior CT is potentially but not definitively visualized. Normal bowel gas pattern without obstruction.  IMPRESSION: Possible distal left ureteric stone. Known left intrarenal stone is faintly but not definitively seen by radiograph   Electronically Signed By: Narda Rutherford M.D. On: 07/08/2023 10:19  No results found for this or any previous visit.  No results found for this or any previous visit.  No results found for this or any previous visit.  No results found for this or any previous visit.  No results found for this or any previous visit.  No results found for this or any previous visit.  No results found for this or any previous visit.   Assessment & Plan:    1. Prostate cancer (HCC) (Primary) *** - Urinalysis, Routine w reflex microscopic  2. Kidney  stones ***  3. Benign prostatic hyperplasia with urinary obstruction ***  4. Nocturia ***   No follow-ups on file.  Wilkie Aye, MD  [facility] [place]

## 2023-10-21 ENCOUNTER — Encounter: Payer: Self-pay | Admitting: Urology

## 2023-10-21 NOTE — Patient Instructions (Signed)

## 2024-03-08 ENCOUNTER — Encounter: Payer: Self-pay | Admitting: Genetic Counselor

## 2024-03-08 ENCOUNTER — Telehealth: Payer: Self-pay | Admitting: Genetic Counselor

## 2024-03-08 NOTE — Telephone Encounter (Signed)
 Wife called to set up genetics appt given PH/FH prostate cancer and daughter's BRCA2 positive status (daughter's mother--patient's wife previously tested neg for BRCA1/2).  Appt scheduled at [facility] on 6/10 at 1pm.

## 2024-04-13 ENCOUNTER — Other Ambulatory Visit: Payer: Self-pay | Admitting: Licensed Clinical Social Worker

## 2024-04-13 ENCOUNTER — Inpatient Hospital Stay: Attending: Oncology | Admitting: Licensed Clinical Social Worker

## 2024-04-13 ENCOUNTER — Encounter: Payer: Self-pay | Admitting: Licensed Clinical Social Worker

## 2024-04-13 ENCOUNTER — Inpatient Hospital Stay

## 2024-04-13 DIAGNOSIS — Z8546 Personal history of malignant neoplasm of prostate: Secondary | ICD-10-CM | POA: Diagnosis not present

## 2024-04-13 DIAGNOSIS — Z8042 Family history of malignant neoplasm of prostate: Secondary | ICD-10-CM

## 2024-04-13 DIAGNOSIS — Z8481 Family history of carrier of genetic disease: Secondary | ICD-10-CM | POA: Diagnosis not present

## 2024-04-13 LAB — GENETIC SCREENING ORDER

## 2024-04-13 NOTE — Progress Notes (Unsigned)
 REFERRING PROVIDER: Self-referred  PRIMARY PROVIDER:  [facility]  PRIMARY REASON FOR VISIT:  1. Family history of BRCA2 gene positive   2. Personal history of prostate cancer   3. Family history of prostate cancer      HISTORY OF PRESENT ILLNESS:   Mr. Bramel, a 73 y.o. male, was seen for a  cancer genetics consultation at the request due to his daughter's recent genetic testing that showed a BRCA2 mutation and his personal/family history of prostate cancer.  Mr. Snuffer presents to clinic today to discuss the possibility of a hereditary predisposition to cancer, genetic testing, and to further clarify his future cancer risks, as well as potential cancer risks for family members.    CANCER HISTORY:  Oncology History  Malignant neoplasm of prostate (HCC)  05/14/2022 Cancer Staging   Staging form: Prostate, AJCC 8th Edition - Clinical stage from 05/14/2022: Stage IIC (cT2c, cN0, cM0, PSA: 8.4, Grade Group: 4) - Signed by Keitha Pata, PA-C on 06/18/2022 Histopathologic type: Adenocarcinoma, NOS Prostate specific antigen (PSA) range: Less than 10 Gleason primary pattern: 4 Gleason secondary pattern: 4 Gleason score: 8 Histologic grading system: 5 grade system Number of biopsy cores examined: 12 Number of biopsy cores positive: 7 Location of positive needle core biopsies: Both sides   06/18/2022 Initial Diagnosis   Malignant neoplasm of prostate (HCC)    In 2023, at the age of 36, Mr. Gatlin was diagnosed with prostate cancer, Gleason score 8.   Past Medical History:  Diagnosis Date   BPH (benign prostatic hyperplasia)    Cancer (HCC)    Prostate   Hay fever    Hypertension    Pneumonia    Thalassemia 06/04/2012    Past Surgical History:  Procedure Laterality Date   COLONOSCOPY  2009   Dr. Lydia Sams: diverticulosis but h/o colon polyps on prior   COLONOSCOPY N/A 04/07/2014   Procedure: COLONOSCOPY;  Surgeon: Suzette Espy, MD;  [facility];  Service: Endoscopy;  Laterality: N/A;  1:45   COLONOSCOPY N/A 07/21/2019   Procedure: COLONOSCOPY;  Surgeon: Suzette Espy, MD;  [facility];  Service: Endoscopy;  Laterality: N/A;  8:30   COLONOSCOPY WITH PROPOFOL  N/A 05/12/2023   Procedure: COLONOSCOPY WITH PROPOFOL ;  Surgeon: Suzette Espy, MD;  [facility];  Service: Endoscopy;  Laterality: N/A;  1:15pm, asa 2   ESOPHAGOGASTRODUODENOSCOPY     multiple times by Dr. Lydia Sams, 7-8 times. yearly for the hiatal hernia. no barrett's esophagus per patient.  Dr. Lydia Sams   ESOPHAGOGASTRODUODENOSCOPY  03/2012   Dr. Lydia Sams: [facility], 7cm paraesophageal hernia, gastritis   EXTRACORPOREAL SHOCK WAVE LITHOTRIPSY Left 07/18/2022   Procedure: EXTRACORPOREAL SHOCK WAVE LITHOTRIPSY (ESWL);  Surgeon: Rea Cambridge, MD;  Location: [facility] OR[facility];  Service: Urology;  Laterality: Left;   EXTRACORPOREAL SHOCK WAVE LITHOTRIPSY Left 07/08/2023   Procedure: EXTRACORPOREAL SHOCK WAVE LITHOTRIPSY (ESWL);  Surgeon: Marco Severs, MD;  Location: [facility];  Service: Urology;  Laterality: Left;   HERNIA REPAIR     hiatal hernia repair, [facility]   HOT HEMOSTASIS  05/12/2023   Procedure: HOT HEMOSTASIS (ARGON PLASMA COAGULATION/BICAP);  Surgeon: Suzette Espy, MD;  [facility];  Service: Endoscopy;;   TONSILLECTOMY     TRANSURETHRAL RESECTION OF PROSTATE      FAMILY HISTORY:  We obtained a detailed, 4-generation family history.  Significant diagnoses are listed below: Family History  Problem Relation Age of Onset  Prostate cancer Father 62       metastatic   Stroke Sister    Kidney disease Sister    Prostate cancer Brother 54       metastatic to bladder/kidney   Cancer Paternal Aunt        unk type   BRCA 1/2 Daughter        BRCA2 positive   Prostate cancer Cousin    Prostate cancer Cousin    Kidney cancer Cousin    Lung cancer Cousin    Lung cancer Cousin    Colon cancer Neg Hx    Mr. Ozburn has 3 daughters. Two of them  have tested positive for a BRCA2 mutation called [id number]. Mr. Malay had 6 brothers and 6 sisters. One brother had metastatic prostate cancer and passed at 27. Another brother had lung cancer and passed in his 70s, he had history of smoking.  Mr. Sanden mother died at 30. No known cancers on this side of the family.  Mr. Arns father had metastatic prostate cancer and passed at 43. Patient had 2 paternal cousins who had prostate cancer as well. Another cousin had kidney and lung cancer and another cousin had lung cancer.   Mr. Meininger is aware of previous family history of genetic testing for hereditary cancer risks. There is no reported Ashkenazi Jewish ancestry. There is no known consanguinity.    GENETIC COUNSELING ASSESSMENT: Mr. Dibert is a 73 y.o. male with a family history of a BRCA2 mutation and personal history of prostate cancer.  We, therefore, discussed and recommended the following at today's visit.   DISCUSSION: We discussed that approximately 10% of cancer is hereditary. We discussed the BRCA2 gene specifically, noting cancer risks and potential management changes associated. Since his wife has had negative BRCA1/2 testing in the past, the BRCA2 mutation in his daughters is most likely coming from him. There are other genes associated with hereditary  cancer as well. Cancers and risks are gene specific. We discussed that testing is beneficial for several reasons including knowing about cancer risks, identifying potential screening and risk-reduction options that may be appropriate, and to understand if other family members could be at risk for cancer and allow them to undergo genetic testing.   We reviewed the characteristics, features and inheritance patterns of hereditary cancer syndromes. We also discussed genetic testing, including the appropriate family members to test, the process of testing, insurance coverage and turn-around-time for results. We discussed the implications of a  negative, positive and/or variant of uncertain significant result. We recommended Mr. Rafuse pursue genetic testing for the BRCA2 family variant and consider reflexing to a panel once initial result is back.  Based on Mr. Fick personal history of cancer and family history of BRCA2 mutation, he meets medical criteria for genetic testing. He will not have a cost for the BRCA2 testing through [facility]'s family variant testing program.   PLAN: After considering the risks, benefits, and limitations, Mr. Clingerman provided informed consent to pursue genetic testing and the blood sample was sent to [facility] for analysis of the BRCA2 gene. Results should be available within approximately 2-3 weeks' time, at which point they will be disclosed by telephone to Mr. Napoli, as will any additional recommendations warranted by these results. Mr. Steffenhagen will receive a summary of his genetic counseling visit and a copy of his results once available. This information will also be available in Epic..   Mr. Bautch questions were answered to his satisfaction today. Our contact  information was provided should additional questions or concerns arise. Thank you for the referral and allowing us  to share in the care of your patient.   Valri Gee, MS, [facility] Genetic Counselor [email or web address] Phone: [phone]  60 minutes were spent on the date of the encounter in service to the patient including preparation, face-to-face consultation, documentation and care coordination. Dr. Nelson Bandy was available for discussion regarding this case.   _______________________________________________________________________ For Office Staff:  Number of people involved in session: 1 Was an Intern/ student involved with case: yes - [employer] intern Moira Andrews was present and assisted with this case.

## 2024-04-15 ENCOUNTER — Ambulatory Visit (HOSPITAL_COMMUNITY)
Admission: RE | Admit: 2024-04-15 | Discharge: 2024-04-15 | Disposition: A | Discharge status: Home / Self Care | Source: Ambulatory Visit | Attending: Urology | Admitting: Urology

## 2024-04-15 DIAGNOSIS — N200 Calculus of kidney: Secondary | ICD-10-CM | POA: Insufficient documentation

## 2024-04-19 ENCOUNTER — Other Ambulatory Visit: Payer: Medicare PPO

## 2024-04-19 DIAGNOSIS — C61 Malignant neoplasm of prostate: Secondary | ICD-10-CM

## 2024-04-20 LAB — PSA: Prostate Specific Ag, Serum: <0.1 ng/mL (ref 0.0–4.0)

## 2024-04-21 ENCOUNTER — Ambulatory Visit: Payer: Self-pay

## 2024-04-26 ENCOUNTER — Encounter: Payer: Self-pay | Admitting: Urology

## 2024-04-26 ENCOUNTER — Ambulatory Visit: Payer: Medicare PPO | Admitting: Urology

## 2024-04-26 VITALS — BP 124/71 | HR 61

## 2024-04-26 DIAGNOSIS — N200 Calculus of kidney: Secondary | ICD-10-CM | POA: Diagnosis not present

## 2024-04-26 DIAGNOSIS — N138 Other obstructive and reflux uropathy: Secondary | ICD-10-CM

## 2024-04-26 DIAGNOSIS — N401 Enlarged prostate with lower urinary tract symptoms: Secondary | ICD-10-CM

## 2024-04-26 DIAGNOSIS — C61 Malignant neoplasm of prostate: Secondary | ICD-10-CM | POA: Diagnosis not present

## 2024-04-26 DIAGNOSIS — N5201 Erectile dysfunction due to arterial insufficiency: Secondary | ICD-10-CM

## 2024-04-26 DIAGNOSIS — R972 Elevated prostate specific antigen [PSA]: Secondary | ICD-10-CM | POA: Diagnosis not present

## 2024-04-26 DIAGNOSIS — R351 Nocturia: Secondary | ICD-10-CM

## 2024-04-26 LAB — URINALYSIS, ROUTINE W REFLEX MICROSCOPIC
Bilirubin, UA: NEGATIVE
Glucose, UA: NEGATIVE
Ketones, UA: NEGATIVE
Leukocytes,UA: NEGATIVE
Nitrite, UA: NEGATIVE
Protein,UA: NEGATIVE
RBC, UA: NEGATIVE
Specific Gravity, UA: 1.025 (ref 1.005–1.030)
Urobilinogen, Ur: 1 mg/dL (ref 0.2–1.0)
pH, UA: 6 (ref 5.0–7.5)

## 2024-04-26 MED ORDER — TAMSULOSIN HCL 0.4 MG PO CAPS: 0.4000 mg | ORAL_CAPSULE | Freq: Two times a day (BID) | ORAL | 3 refills | Status: DC

## 2024-04-26 MED ORDER — TADALAFIL 20 MG PO TABS: 20.0000 mg | ORAL_TABLET | ORAL | 5 refills | Status: AC | PRN

## 2024-04-26 NOTE — Progress Notes (Signed)
 04/26/2024 10:20 AM   Victory Law 08/27/1951 [id number]  Referring provider: [facility] [address] [place],  [place] [address]  Followup prostate cancer and nephrolithiasis   HPI: Walter Harrison is a 72yo here for followup fro BPh with nocturia, prostate cancer and nephrolithiasis. PSA undetectable. KUb from 6/12 shows no definitive calculi. IPSS 7 QOL 2 on flomax  0.4mg  daily. Nocturia 1-2x depending on fluid consumption. Urine stream strong. No straining to urinate. He continues to have difficulty getting and maintaining an erection.    PMH: Past Medical History:  Diagnosis Date   BPH (benign prostatic hyperplasia)    Cancer (HCC)    Prostate   Hay fever    Hypertension    Pneumonia    Thalassemia 06/04/2012    Surgical History: Past Surgical History:  Procedure Laterality Date   COLONOSCOPY  2009   Dr. Tobie: diverticulosis but h/o colon polyps on prior   COLONOSCOPY N/A 04/07/2014   Procedure: COLONOSCOPY;  Surgeon: Lamar CHRISTELLA Hollingshead, MD;  [facility];  Service: Endoscopy;  Laterality: N/A;  1:45   COLONOSCOPY N/A 07/21/2019   Procedure: COLONOSCOPY;  Surgeon: Hollingshead Lamar CHRISTELLA, MD;  [facility];  Service: Endoscopy;  Laterality: N/A;  8:30   COLONOSCOPY WITH PROPOFOL  N/A 05/12/2023   Procedure: COLONOSCOPY WITH PROPOFOL ;  Surgeon: Hollingshead Lamar CHRISTELLA, MD;  [facility];  Service: Endoscopy;  Laterality: N/A;  1:15pm, asa 2   ESOPHAGOGASTRODUODENOSCOPY     multiple times by Dr. Tobie, 7-8 times. yearly for the hiatal hernia. no barrett's esophagus per patient.  Dr. Tobie   ESOPHAGOGASTRODUODENOSCOPY  03/2012   Dr. Tobie: [facility], 7cm paraesophageal hernia, gastritis   EXTRACORPOREAL SHOCK WAVE LITHOTRIPSY Left 07/18/2022   Procedure: EXTRACORPOREAL SHOCK WAVE LITHOTRIPSY (ESWL);  Surgeon: Kassie Ozell SAUNDERS, MD;  Location: [facility] OR[facility];  Service: Urology;  Laterality: Left;   EXTRACORPOREAL SHOCK WAVE LITHOTRIPSY Left 07/08/2023   Procedure:  EXTRACORPOREAL SHOCK WAVE LITHOTRIPSY (ESWL);  Surgeon: Sherrilee Belvie CROME, MD;  [facility];  Service: Urology;  Laterality: Left;   HERNIA REPAIR     hiatal hernia repair, [facility]   HOT HEMOSTASIS  05/12/2023   Procedure: HOT HEMOSTASIS (ARGON PLASMA COAGULATION/BICAP);  Surgeon: Hollingshead Lamar CHRISTELLA, MD;  [facility];  Service: Endoscopy;;   TONSILLECTOMY     TRANSURETHRAL RESECTION OF PROSTATE      Home Medications:  Allergies as of 04/26/2024       Reactions   Sulfa Antibiotics Nausea Only        Medication List        Accurate as of April 26, 2024 10:20 AM. If you have any questions, ask your nurse or doctor.          allopurinol 300 MG tablet Commonly known as: ZYLOPRIM Take 300 mg by mouth daily.   aspirin 81 MG chewable tablet Chew 81 mg by mouth daily after breakfast.   chlorthalidone 25 MG tablet Commonly known as: HYGROTON Take 12.5 mg by mouth daily after breakfast.   multivitamin with minerals Tabs tablet Take 1 tablet by mouth daily after breakfast. Centrum Silver   tamsulosin  0.4 MG Caps capsule Commonly known as: FLOMAX  Take 1 capsule (0.4 mg total) by mouth 2 (two) times daily after a meal.        Allergies:  Allergies  Allergen Reactions   Sulfa Antibiotics Nausea Only    Family History: Family History  Problem Relation Age of Onset  Prostate cancer Father 13       metastatic   Stroke Sister    Kidney disease Sister    Prostate cancer Brother 45       metastatic to bladder/kidney   Cancer Paternal Aunt        unk type   BRCA 1/2 Daughter        BRCA2 positive   Prostate cancer Cousin    Prostate cancer Cousin    Kidney cancer Cousin    Lung cancer Cousin    Lung cancer Cousin    Colon cancer Neg Hx     Social History:  reports that he has never smoked. He has quit using smokeless tobacco. He reports that he does not drink alcohol and does not use drugs.  ROS: All other review of systems were reviewed and are  negative except what is noted above in HPI  Physical Exam: BP 124/71   Pulse 61   Constitutional:  Alert and oriented, No acute distress. HEENT:  [place], moist mucus membranes.  Trachea midline, no masses. Cardiovascular: No clubbing, cyanosis, or edema. Respiratory: Normal respiratory effort, no increased work of breathing. GI: Abdomen is soft, nontender, nondistended, no abdominal masses GU: No CVA tenderness.  Lymph: No cervical or inguinal lymphadenopathy. Skin: No rashes, bruises or suspicious lesions. Neurologic: Grossly intact, no focal deficits, moving all 4 extremities. Psychiatric: Normal mood and affect.  Laboratory Data: Lab Results  Component Value Date   WBC 6.9 06/27/2023   HGB 12.8 (L) 06/27/2023   HCT 38.7 (L) 06/27/2023   MCV 90.2 06/27/2023   PLT 209 06/27/2023    Lab Results  Component Value Date   CREATININE 1.26 (H) 06/27/2023    No results found for: PSA  No results found for: TESTOSTERONE  No results found for: HGBA1C  Urinalysis    Component Value Date/Time   COLORURINE YELLOW 06/27/2023 0438   APPEARANCEUR Clear 10/17/2023 1200   LABSPEC 1.038 (H) 06/27/2023 0438   PHURINE 5.0 06/27/2023 0438   GLUCOSEU Negative 10/17/2023 1200   HGBUR LARGE (A) 06/27/2023 0438   BILIRUBINUR Negative 10/17/2023 1200   KETONESUR NEGATIVE 06/27/2023 0438   PROTEINUR Negative 10/17/2023 1200   PROTEINUR NEGATIVE 06/27/2023 0438   NITRITE Negative 10/17/2023 1200   NITRITE NEGATIVE 06/27/2023 0438   LEUKOCYTESUR Negative 10/17/2023 1200   LEUKOCYTESUR NEGATIVE 06/27/2023 0438    Lab Results  Component Value Date   LABMICR Comment 10/17/2023   WBCUA 0-5 07/18/2023   LABEPIT 0-10 07/18/2023   BACTERIA None seen 07/18/2023    Pertinent Imaging: KUb 04/15/24: Images reviewed and discussed with the patient  Results for orders placed during the hospital encounter of 04/15/24  Abdomen 1 view (KUB)  Narrative CLINICAL DATA:   Nephrolithiasis.  EXAM: ABDOMEN - 1 VIEW  COMPARISON:  Radiograph 07/08/2023, CT 06/27/2023  FINDINGS: The previous left ureteral calculus is no longer seen. The known left intrarenal stone is potentially visualized, although obscured by bowel gas on the current exam. Multiple pelvic phleboliths are unchanged. Moderate stool in the right colon. No bowel obstruction.  IMPRESSION: The previous left ureteral calculus is no longer seen. The known left intrarenal stone is potentially visualized, although obscured by bowel gas on the current exam.   Electronically Signed By: Andrea Gasman M.D. On: 04/20/2024 16:54  No results found for this or any previous visit.  No results found for this or any previous visit.  No results found for this or any previous visit.  No results  found for this or any previous visit.  No results found for this or any previous visit.  No results found for this or any previous visit.  No results found for this or any previous visit.   Assessment & Plan:    1.  Prostate cancer (HCC) Followup 6 months with a PSA  2. Kidney stones -1 year with KUb  3. Benign prostatic hyperplasia with urinary obstruction Continue flomax  BID  4. Nocturia Flomax  0.4mg  BID  5. Erectile dysfunction -we will trial tadalafil 20mg  prn   No follow-ups on file.  Belvie Clara, MD  [facility] [place]

## 2024-04-26 NOTE — Patient Instructions (Signed)

## 2024-05-04 ENCOUNTER — Ambulatory Visit: Payer: Self-pay | Admitting: Licensed Clinical Social Worker

## 2024-05-04 ENCOUNTER — Telehealth: Payer: Self-pay | Admitting: Licensed Clinical Social Worker

## 2024-05-04 DIAGNOSIS — Z1501 Genetic susceptibility to malignant neoplasm of breast: Secondary | ICD-10-CM | POA: Insufficient documentation

## 2024-05-04 DIAGNOSIS — Z1379 Encounter for other screening for genetic and chromosomal anomalies: Secondary | ICD-10-CM | POA: Insufficient documentation

## 2024-05-04 NOTE — Progress Notes (Signed)
 Genetic Test Results - BRCA2+  HPI:   Walter Harrison was previously seen in the [facility] clinic due to a personal history of prostate cancer and his daughters' recent genetic testing that showed a BRCA2 mutation.  Please refer to our prior cancer genetics clinic note for more information regarding our discussion, assessment and recommendations, at the time. Mr. Manon recent genetic test results were disclosed to him, as were recommendations warranted by these results. These results and recommendations are discussed in more detail below.  CANCER HISTORY:  Oncology History  Malignant neoplasm of prostate (HCC)  05/14/2022 Cancer Staging   Staging form: Prostate, AJCC 8th Edition - Clinical stage from 05/14/2022: Stage IIC (cT2c, cN0, cM0, PSA: 8.4, Grade Group: 4) - Signed by Sherwood Rise, PA-C on 06/18/2022 Histopathologic type: Adenocarcinoma, NOS Prostate specific antigen (PSA) range: Less than 10 Gleason primary pattern: 4 Gleason secondary pattern: 4 Gleason score: 8 Histologic grading system: 5 grade system Number of biopsy cores examined: 12 Number of biopsy cores positive: 7 Location of positive needle core biopsies: Both sides   06/18/2022 Initial Diagnosis   Malignant neoplasm of prostate (HCC)     FAMILY HISTORY:  We obtained a detailed, 4-generation family history.  Significant diagnoses are listed below: Family History  Problem Relation Age of Onset   Prostate cancer Father 66       metastatic   Stroke Sister    Kidney disease Sister    Prostate cancer Brother 25       metastatic to bladder/kidney   Cancer Paternal Aunt        unk type   BRCA 1/2 Daughter        BRCA2 positive   Prostate cancer Cousin    Prostate cancer Cousin    Kidney cancer Cousin    Lung cancer Cousin    Lung cancer Cousin    Colon cancer Neg Hx     Mr. Kirtz has 3 daughters. Two of them have tested positive for a BRCA2 mutation called [id number]. Mr. Whang had 6  brothers and 6 sisters. One brother had metastatic prostate cancer and passed at 76. Another brother had lung cancer and passed in his 3s, he had history of smoking.   Mr. Sobol mother died at 70. No known cancers on this side of the family.   Mr. Elmore father had metastatic prostate cancer and passed at 101. Patient had 2 paternal cousins who had prostate cancer as well. Another cousin had kidney and lung cancer and another cousin had lung cancer.    Mr. Prosise is aware of previous family history of genetic testing for hereditary cancer risks. There is no reported Ashkenazi Jewish ancestry. There is no known consanguinity.        GENETIC TEST RESULTS:  Mr. 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 Specifically, this variant is [phone], previously identified in his daughter.  The test report has been scanned into EPIC and is located under the Molecular Pathology section of the Results Review tab.  A portion of the result report is included below for reference. Genetic testing reported out on 04/30/2024.     Clinical Information: Hereditary breast and ovarian cancer (HBOC) syndrome is characterized by an increased lifetime risk for, generally, adult-onset cancers including, breast, contralateral breast, male breast, ovarian, prostate, melanoma and pancreatic.  The cancers associated with BRCA2 are: Male breast cancer, up to an 84% risk In women with a history of  breast cancer, the risk for contralateral breast cancer 10 years after breast cancer diagnosis is 10-30%.  Male breast cancer, up to an 8% risk Ovarian cancer, 13-29% risk Pancreatic cancer, 5-10% risk Prostate cancer, 19-61% risk Melanoma, elevated risk   Management Recommendations: Breast Screening/Risk Reduction:  Women: Breast awareness starting at age 52 Clinical breast examination every 6-12 months starting at age 37  Breast cancer screening: Age 62-29  years, annual breast MRI with and without contrast (or mammogram, if MRI is unavailable), although the age to initiate screening may be individualized based on family history Age 40-75 years, annual mammogram and breast MRI with and without contrast Age >75 years, management should be considered on an individual basis For women with a BRCA2 pathogenic or likely pathogenic variant who are treated for breast cancer and have not had a bilateral mastectomy, screening with annual mammogram and breast MRI should continue as described above. The option of prophylactic bilateral risk-reducing mastectomy (RRM), removal of the breast tissue before cancer develops, is the best option for significantly decreasing the risk of developing breast cancer. Studies have shown mastectomies reduce the risk of breast cancer by 90-95% in women with a BRCA2 mutation.   Males: Breast self-exam training and education starting at age 13 years Annual clinical breast exam starting at age 64 years  Consider annual mammogram starting at age 59 or 10 years before the earliest known male breast cancer in the family (whichever comes first).   Gynecological Cancer Screening/Risk Reduction: It is recommended that women with a BRCA2 mutation have a risk-reducing salpingo oophorectomy (RRSO), removal of the ovaries and fallopian tubes. It is reasonable to delay RRSO until age 50-45 years unless age at diagnosis in the family warrants earlier age for consideration of RRSO.  Having a RRSO is estimated to reduce the risk of ovarian cancer by up to 96%. There is still a small risk of developing an ovarian-like cancer in the lining of the abdomen, called the peritoneum. Another benefit to having the ovaries removed is the risk reduction for breast cancer. If the ovaries are removed before menopause, the risk of developing breast cancer is reduced. Ovarian cancer screening is an option for women who chose not to have a RRSO or who have not  yet completed their family. Current screening methods for ovarian cancer are neither sensitive nor specific, meaning that often early stage ovarian cancer cannot be diagnosed through this screening.  Screening can also be falsely positive with no cancer present. For this reason, RRSO is recommended over screening. If ovarian cancer screening is recommended by a physician, it could include: CA-125 blood tests Transvaginal ultrasounds Clinical pelvic exams   Skin Cancer Screening and Risk Reduction: Regular skin self-examinations Individuals should notify their physicians of any changes to moles such as increasing in size, darkening in color, or other change in appearance. Annual skin examinations by a dermatologist  Follow sun-safety recommendations such as: Using UVA and UVB 30 SPF or higher sunscreen Avoiding sunburns Limiting sun exposure, especially during the hours of 11am-4pm  Wearing protective clothing and sunglasses Avoid using tanning beds For more information about the prevention of melanoma visit [email or web address]   Prostate Cancer Screening: Annual digital rectal exam (DRE) at age 47 Annual PSA blood test at age 22  Pancreatic Cancer Screening/Risk Reduction: Avoid smoking, heavy alcohol use. Consider pancreatic cancer screening beginning at age 9 (or 53 years younger than earliest exocrine pancreatic cancer diagnosis in the family, whichever is earlier) For individuals considering pancreatic cancer  screening, the Panel recommends that screening be performed in experienced high-volume centers. The Panel recommends that such screening only take place after an in-depth discussion about the potential limitations to screening,  including cost, the high incidence of benign or indeterminate pancreatic abnormalities, and uncertainties about the potential benefits of  pancreatic cancer screening.  Consider screening using annual contrast-enhanced MRI/magnetic resonance  cholangiopancreatography (MRCP) and/or endoscopic ultrasound (EUS), with consideration of shorter screening intervals, based on clinical judgment, for individuals found to have potentially concerning abnormalities on screening. Studies have typically started screening with contrast-enhanced MRCP and/or EUS in individuals at increased risk for pancreatic cancer. The Panel emphasizes that most small cystic lesions found on screening will not warrant biopsy, surgical resection, or any other intervention  Additional Considerations: Individuals at risk for developing breast and ovarian cancer may benefit from the use of medication to reduce their risk for cancer. These medications are referred to as chemoprevention. For example, oral contraceptive use has been shown to reduce the risk of ovarian cancer by approximately 60% in BRCA2 mutation carriers if taken for at least 5 years. This risk reduction remains even after discontinuation of oral contraceptives. Recent studies have suggested PARP inhibitors may be a beneficial chemotherapeutic agent for a subset of patients with BRCA2-associated breast, ovarian, prostate, and pancreatic cancers. Clinical trials are currently in process to determine if and how these agents can be useful in the treatment of BRCA2 cancer patients Patients of reproductive age should be made aware of options for prenatal diagnosis and assisted reproduction including pre-implantation genetic diagnosis. Individuals with a single pathogenic BRCA2 variant are carriers of Fanconi anemia. Fanconi anemia is characterized by developmental delay apparent from infancy, short stature, microcephaly, and coarse dysmorphic features. For there to be a risk of Fanconi anemia in offspring, both the patient and their partner would each have to carry a pathogenic variant in BRCA2. In this case, the risk of having an affected child is 25%.   This information is based on current understanding of the gene and  may change in the future. Guidelines from NCCN Genetic/Familial High-Risk Assessment: Breast, Ovarian, Pancreatic, Prostate Cancer v.2.2025.  Implications for Family Members: Hereditary predisposition to cancer due to pathogenic variants in the BRCA2 gene has autosomal dominant inheritance. This means that an individual with a pathogenic variant has a 50% chance of passing the condition on to his/her offspring. Identification of a pathogenic variant allows for the recognition of at-risk relatives who can pursue testing for the familial variant.  Family members are encouraged to consider genetic testing for this familial pathogenic variant. As there are generally no childhood cancer risks associated with a single pathogenic variant in the BRCA2 gene, individuals in the family are not recommended to have testing until they reach at least 71 years of age. They may contact our office at [phone] for more information or to schedule an appointment.  Family members who live outside of the area are encouraged to find a genetic counselor in their area by visiting: [email or web address].  Resources: FORCE (Facing Our Risk of Cancer Empowered) is a resource for those with a hereditary predisposition to develop cancer.  FORCE provides information about risk reduction, advocacy, legislation, and clinical trials.  Additionally, FORCE provides a platform for collaboration and support; which includes: peer navigation, message boards, local support groups, a toll-free helpline, research registry and recruitment, advocate training, published medical research, webinars, brochures, mastectomy photos, and more.  For more information, visit www.facingourrisk.org  PLAN: 1. These results will be made  available to  Mr. Marcin PCP and his urologist, Dr. Sherrilee. He would like these providers to follow him Shober-term for this indication and coordinate any appropriate screening.  2. Mr. Gahm plans to  discuss these results with his family and will reach out to us  if we can be of any assistance in coordinating genetic testing for any relatives.     We encouraged Mr. Abruzzo to remain in contact with us  on an annual basis so we can update his personal and family histories, and let him know of advances in cancer genetics that may benefit the family. Our contact number was provided. Mr. Bialecki questions were answered to his satisfaction today, and he knows he is welcome to call anytime with additional questions.   Dena Cary, MS, [facility] Genetic Counselor [email or web address] Phone: [phone]

## 2024-05-04 NOTE — Telephone Encounter (Signed)
 I contacted Mr. Walter Harrison to discuss his genetic testing results. Pathogenic mutation in BRCA2 called [id number] identified.   Detailed clinic note to follow.   The test report has been scanned into EPIC and is located under the Molecular Pathology section of the Results Review tab.  A portion of the result report is included below for reference.      Dena Cary, MS, [facility] Genetic Counselor [email or web address] Phone: [phone]

## 2024-11-08 ENCOUNTER — Other Ambulatory Visit

## 2024-11-08 DIAGNOSIS — R972 Elevated prostate specific antigen [PSA]: Secondary | ICD-10-CM

## 2024-11-09 LAB — PSA: Prostate Specific Ag, Serum: <0.1 ng/mL (ref 0.0–4.0)

## 2024-11-10 ENCOUNTER — Ambulatory Visit: Admitting: Urology

## 2024-11-15 ENCOUNTER — Ambulatory Visit: Admitting: Urology

## 2024-11-15 VITALS — BP 121/79 | HR 108

## 2024-11-15 DIAGNOSIS — N401 Enlarged prostate with lower urinary tract symptoms: Secondary | ICD-10-CM

## 2024-11-15 DIAGNOSIS — N138 Other obstructive and reflux uropathy: Secondary | ICD-10-CM

## 2024-11-15 DIAGNOSIS — N200 Calculus of kidney: Secondary | ICD-10-CM

## 2024-11-15 DIAGNOSIS — C61 Malignant neoplasm of prostate: Secondary | ICD-10-CM

## 2024-11-15 DIAGNOSIS — N5201 Erectile dysfunction due to arterial insufficiency: Secondary | ICD-10-CM

## 2024-11-15 DIAGNOSIS — R351 Nocturia: Secondary | ICD-10-CM

## 2024-11-15 LAB — URINALYSIS, ROUTINE W REFLEX MICROSCOPIC
Bilirubin, UA: NEGATIVE
Glucose, UA: NEGATIVE
Leukocytes,UA: NEGATIVE
Nitrite, UA: NEGATIVE
Protein,UA: NEGATIVE
RBC, UA: NEGATIVE
Specific Gravity, UA: 1.025 (ref 1.005–1.030)
Urobilinogen, Ur: 0.2 mg/dL (ref 0.2–1.0)
pH, UA: 6 (ref 5.0–7.5)

## 2024-11-15 MED ORDER — TAMSULOSIN HCL 0.4 MG PO CAPS: 0.4000 mg | ORAL_CAPSULE | Freq: Every day | ORAL | 3 refills | Status: AC

## 2024-11-15 NOTE — Progress Notes (Unsigned)
 "  11/15/2024 10:26 AM   Walter Harrison 11/21/1950 [id number]  Referring provider: [facility] [address] [place],  [place] [address]  No chief complaint on file.   HPI: Mr Walter Harrison is a 73yo here for followup for prostate cancer and BPh with nocturia. PSA undetectable. IPSS 6 QOL 2 on flomax  daily   PMH: Past Medical History:  Diagnosis Date   BPH (benign prostatic hyperplasia)    Cancer (HCC)    Prostate   Hay fever    Hypertension    Pneumonia    Thalassemia 06/04/2012    Surgical History: Past Surgical History:  Procedure Laterality Date   COLONOSCOPY  2009   Dr. Tobie: diverticulosis but h/o colon polyps on prior   COLONOSCOPY N/A 04/07/2014   Procedure: COLONOSCOPY;  Surgeon: Lamar CHRISTELLA Hollingshead, MD;  [facility];  Service: Endoscopy;  Laterality: N/A;  1:45   COLONOSCOPY N/A 07/21/2019   Procedure: COLONOSCOPY;  Surgeon: Hollingshead Lamar CHRISTELLA, MD;  Location: [facility];  Service: Endoscopy;  Laterality: N/A;  8:30   COLONOSCOPY WITH PROPOFOL  N/A 05/12/2023   Procedure: COLONOSCOPY WITH PROPOFOL ;  Surgeon: Hollingshead Lamar CHRISTELLA, MD;  [facility]: [facility];  Service: Endoscopy;  Laterality: N/A;  1:15pm, asa 2   ESOPHAGOGASTRODUODENOSCOPY     multiple times by Dr. Tobie, 7-8 times. yearly for the hiatal hernia. no barrett's esophagus per patient.  Dr. Tobie   ESOPHAGOGASTRODUODENOSCOPY  03/2012   Dr. Tobie: [facility], 7cm paraesophageal hernia, gastritis   EXTRACORPOREAL SHOCK WAVE LITHOTRIPSY Left 07/18/2022   Procedure: EXTRACORPOREAL SHOCK WAVE LITHOTRIPSY (ESWL);  Surgeon: Kassie Ozell SAUNDERS, MD;  Location: [facility] OR[facility];  Service: Urology;  Laterality: Left;   EXTRACORPOREAL SHOCK WAVE LITHOTRIPSY Left 07/08/2023   Procedure: EXTRACORPOREAL SHOCK WAVE LITHOTRIPSY (ESWL);  Surgeon: Sherrilee Belvie CROME, MD;  Location: [facility];  Service: Urology;  Laterality: Left;   HERNIA REPAIR     hiatal hernia repair, [facility]   HOT HEMOSTASIS  05/12/2023   Procedure: HOT HEMOSTASIS  (ARGON PLASMA COAGULATION/BICAP);  Surgeon: Hollingshead Lamar CHRISTELLA, MD;  Location: [facility];  Service: Endoscopy;;   TONSILLECTOMY     TRANSURETHRAL RESECTION OF PROSTATE      Home Medications:  Allergies as of 11/15/2024       Reactions   Sulfa Antibiotics Nausea Only        Medication List        Accurate as of November 15, 2024 10:26 AM. If you have any questions, ask your nurse or doctor.          allopurinol 300 MG tablet Commonly known as: ZYLOPRIM Take 300 mg by mouth daily.   aspirin 81 MG chewable tablet Chew 81 mg by mouth daily after breakfast.   chlorthalidone 25 MG tablet Commonly known as: HYGROTON Take 12.5 mg by mouth daily after breakfast.   multivitamin with minerals Tabs tablet Take 1 tablet by mouth daily after breakfast. Centrum Silver   tadalafil  20 MG tablet Commonly known as: CIALIS  Take 1 tablet (20 mg total) by mouth as needed.   tamsulosin  0.4 MG Caps capsule Commonly known as: FLOMAX  Take 1 capsule (0.4 mg total) by mouth 2 (two) times daily after a meal.        Allergies: Allergies[1]  Family History: Family History  Problem Relation Age of Onset   Prostate cancer Father 64       metastatic   Stroke Sister    Kidney disease Sister  Prostate cancer Brother 53       metastatic to bladder/kidney   Cancer Paternal Aunt        unk type   BRCA 1/2 Daughter        BRCA2 positive   Prostate cancer Cousin    Prostate cancer Cousin    Kidney cancer Cousin    Lung cancer Cousin    Lung cancer Cousin    Colon cancer Neg Hx     Social History:  reports that he has never smoked. He has quit using smokeless tobacco. He reports that he does not drink alcohol and does not use drugs.  ROS: All other review of systems were reviewed and are negative except what is noted above in HPI  Physical Exam: BP 121/79   Pulse (!) 108   Constitutional:  Alert and oriented, No acute distress. HEENT: [place] AT, moist mucus membranes.  Trachea  midline, no masses. Cardiovascular: No clubbing, cyanosis, or edema. Respiratory: Normal respiratory effort, no increased work of breathing. GI: Abdomen is soft, nontender, nondistended, no abdominal masses GU: No CVA tenderness.  Lymph: No cervical or inguinal lymphadenopathy. Skin: No rashes, bruises or suspicious lesions. Neurologic: Grossly intact, no focal deficits, moving all 4 extremities. Psychiatric: Normal mood and affect.  Laboratory Data: Lab Results  Component Value Date   WBC 6.9 06/27/2023   HGB 12.8 (L) 06/27/2023   HCT 38.7 (L) 06/27/2023   MCV 90.2 06/27/2023   PLT 209 06/27/2023    Lab Results  Component Value Date   CREATININE 1.26 (H) 06/27/2023    No results found for: PSA  No results found for: TESTOSTERONE  No results found for: HGBA1C  Urinalysis    Component Value Date/Time   COLORURINE YELLOW 06/27/2023 0438   APPEARANCEUR Clear 04/26/2024 1011   LABSPEC 1.038 (H) 06/27/2023 0438   PHURINE 5.0 06/27/2023 0438   GLUCOSEU Negative 04/26/2024 1011   HGBUR LARGE (A) 06/27/2023 0438   BILIRUBINUR Negative 04/26/2024 1011   KETONESUR NEGATIVE 06/27/2023 0438   PROTEINUR Negative 04/26/2024 1011   PROTEINUR NEGATIVE 06/27/2023 0438   NITRITE Negative 04/26/2024 1011   NITRITE NEGATIVE 06/27/2023 0438   LEUKOCYTESUR Negative 04/26/2024 1011   LEUKOCYTESUR NEGATIVE 06/27/2023 0438    Lab Results  Component Value Date   LABMICR Comment 04/26/2024   WBCUA 0-5 07/18/2023   LABEPIT 0-10 07/18/2023   BACTERIA None seen 07/18/2023    Pertinent Imaging: *** Results for orders placed during the hospital encounter of 04/15/24  Abdomen 1 view (KUB)  Narrative CLINICAL DATA:  Nephrolithiasis.  EXAM: ABDOMEN - 1 VIEW  COMPARISON:  Radiograph 07/08/2023, CT 06/27/2023  FINDINGS: The previous left ureteral calculus is no longer seen. The known left intrarenal stone is potentially visualized, although obscured by bowel gas on the  current exam. Multiple pelvic phleboliths are unchanged. Moderate stool in the right colon. No bowel obstruction.  IMPRESSION: The previous left ureteral calculus is no longer seen. The known left intrarenal stone is potentially visualized, although obscured by bowel gas on the current exam.   Electronically Signed By: Andrea Gasman M.D. On: 04/20/2024 16:54  No results found for this or any previous visit.  No results found for this or any previous visit.  No results found for this or any previous visit.  No results found for this or any previous visit.  No results found for this or any previous visit.  No results found for this or any previous visit.  No results found for  this or any previous visit.   Assessment & Plan:    1. Prostate cancer (HCC) (Primary) Followup 6 months with PSA - Urinalysis, Routine w reflex microscopic  2. Benign prostatic hyperplasia with urinary obstruction Continue flomax  daily  3. Nocturia Continue flomax  daily  4. Erectile dysfunction due to arterial insufficiency Patient defers therapy at this time   No follow-ups on file.  Belvie Clara, MD  [facility] [place]      [1]  Allergies Allergen Reactions   Sulfa Antibiotics Nausea Only   "

## 2024-11-18 ENCOUNTER — Encounter: Payer: Self-pay | Admitting: Urology

## 2024-11-18 NOTE — Patient Instructions (Signed)

## 2025-05-12 ENCOUNTER — Other Ambulatory Visit

## 2025-05-25 ENCOUNTER — Ambulatory Visit: Admitting: Urology
